# Patient Record
Sex: Male | Born: 1950 | Hispanic: Refuse to answer | Marital: Married | Smoking: Former smoker
Health system: Southern US, Community
[De-identification: ages and names within clinical notes are randomized; demographics above are authoritative.]

## PROBLEM LIST (undated history)

## (undated) DIAGNOSIS — M1990 Unspecified osteoarthritis, unspecified site: Secondary | ICD-10-CM

## (undated) DIAGNOSIS — R011 Cardiac murmur, unspecified: Secondary | ICD-10-CM

## (undated) DIAGNOSIS — K759 Inflammatory liver disease, unspecified: Secondary | ICD-10-CM

## (undated) HISTORY — PX: TONSILLECTOMY: SUR1361

## (undated) HISTORY — PX: CATARACT EXTRACTION, BILATERAL: SHX1313

## (undated) HISTORY — PX: IRRIGATION AND DEBRIDEMENT SEBACEOUS CYST: SHX5255

---

## 2012-09-19 ENCOUNTER — Other Ambulatory Visit (HOSPITAL_COMMUNITY): Payer: Self-pay | Admitting: Orthopaedic Surgery

## 2012-09-24 ENCOUNTER — Encounter (HOSPITAL_COMMUNITY): Payer: Self-pay | Admitting: Pharmacy Technician

## 2012-09-28 ENCOUNTER — Inpatient Hospital Stay (HOSPITAL_COMMUNITY): Admission: RE | Admit: 2012-09-28 | Payer: Self-pay | Source: Ambulatory Visit

## 2012-10-01 ENCOUNTER — Encounter (HOSPITAL_COMMUNITY)
Admission: RE | Admit: 2012-10-01 | Discharge: 2012-10-01 | Disposition: A | Discharge status: Home / Self Care | Payer: BC Managed Care – PPO | Source: Ambulatory Visit | Attending: Orthopaedic Surgery | Admitting: Orthopaedic Surgery

## 2012-10-01 ENCOUNTER — Encounter (HOSPITAL_COMMUNITY): Payer: Self-pay

## 2012-10-01 HISTORY — DX: Unspecified osteoarthritis, unspecified site: M19.90

## 2012-10-01 HISTORY — DX: Inflammatory liver disease, unspecified: K75.9

## 2012-10-01 HISTORY — DX: Cardiac murmur, unspecified: R01.1

## 2012-10-01 LAB — CBC
MCH: 33.1 pg (ref 26.0–34.0)
MCHC: 34.2 g/dL (ref 30.0–36.0)
Platelets: 235 10*3/uL (ref 150–400)
RBC: 4.56 MIL/uL (ref 4.22–5.81)

## 2012-10-01 LAB — BASIC METABOLIC PANEL
BUN: 13 mg/dL (ref 6–23)
Calcium: 9.9 mg/dL (ref 8.4–10.5)
GFR calc Af Amer: >90 mL/min (ref >90)
GFR calc non Af Amer: >90 mL/min (ref >90)
Glucose, Bld: 91 mg/dL (ref 70–99)
Sodium: 140 mEq/L (ref 135–145)

## 2012-10-01 LAB — URINALYSIS, ROUTINE W REFLEX MICROSCOPIC
Ketones, ur: NEGATIVE mg/dL
Nitrite: NEGATIVE
Protein, ur: NEGATIVE mg/dL

## 2012-10-01 LAB — SURGICAL PCR SCREEN: MRSA, PCR: NEGATIVE

## 2012-10-01 NOTE — Patient Instructions (Addendum)
20 Patrick Rivas [facility]  10/01/2012   Your procedure is scheduled on:   10-05-2012  Report to [facility] at    0530    AM .  Call this number if you have problems the morning of surgery: [phone]  Or Presurgical Testing [phone]([place])      Do not eat food:After Midnight.    Take these medicines the morning of surgery with A SIP OF WATER: Tramadol   Do not wear jewelry, make-up or nail polish.  Do not wear lotions, powders, or perfumes. You may wear deodorant.  Do not shave 12 hours prior to first CHG shower(legs and under arms).(face and neck okay.)  Do not bring valuables to the hospital.  Contacts, dentures or bridgework,body piercing,  may not be worn into surgery.  Leave suitcase in the car. After surgery it may be brought to your room.  For patients admitted to the hospital, checkout time is 11:00 AM the day of discharge.   Patients discharged the day of surgery will not be allowed to drive home. Must have responsible person with you x 24 hours once discharged.  Name and phone number of your driver: Patrick Rivas, spouse -[phone] cell  Special Instructions: CHG(Chlorhedine 4%-"Hibiclens","Betasept","Aplicare") Shower Use Special Wash: see special instructions.(avoid face and genitals)   Please read over the following fact sheets that you were given: MRSA Information, Blood Transfusion fact sheet, Incentive Spirometry Instruction.    Failure to follow these instructions may result in Cancellation of your surgery.   Patient signature_______________________________________________________

## 2012-10-01 NOTE — Pre-Procedure Instructions (Signed)
10-01-12 EKG 10'13 -Report with chart. W. Kairie Vangieson,RN 10-01-12 1445 Urinalysis report faxed to Dr. Alben Spittle office. W. Kennon Portela

## 2012-10-01 NOTE — Progress Notes (Signed)
10-01-12 labs now viewable in Epic. Note urinalysis report. Patrick Rivas

## 2012-10-03 LAB — URINE CULTURE: Colony Count: 40000

## 2012-10-05 ENCOUNTER — Encounter (HOSPITAL_COMMUNITY): Payer: Self-pay | Admitting: *Deleted

## 2012-10-05 ENCOUNTER — Encounter (HOSPITAL_COMMUNITY): Payer: Self-pay | Admitting: Registered Nurse

## 2012-10-05 ENCOUNTER — Inpatient Hospital Stay (HOSPITAL_COMMUNITY): Payer: BC Managed Care – PPO

## 2012-10-05 ENCOUNTER — Encounter (HOSPITAL_COMMUNITY)
Admission: RE | Disposition: A | Discharge status: Home / Self Care | Payer: Self-pay | Source: Ambulatory Visit | Attending: Orthopaedic Surgery

## 2012-10-05 ENCOUNTER — Ambulatory Visit (HOSPITAL_COMMUNITY): Payer: BC Managed Care – PPO | Admitting: Registered Nurse

## 2012-10-05 ENCOUNTER — Ambulatory Visit (HOSPITAL_COMMUNITY): Payer: BC Managed Care – PPO

## 2012-10-05 ENCOUNTER — Inpatient Hospital Stay (HOSPITAL_COMMUNITY)
Admission: RE | Admit: 2012-10-05 | Discharge: 2012-10-07 | DRG: 818 | Disposition: A | Discharge status: Home / Self Care | Payer: BC Managed Care – PPO | Source: Ambulatory Visit | Attending: Orthopaedic Surgery | Admitting: Orthopaedic Surgery

## 2012-10-05 DIAGNOSIS — IMO0002 Reserved for concepts with insufficient information to code with codable children: Secondary | ICD-10-CM | POA: Diagnosis present

## 2012-10-05 DIAGNOSIS — Z79899 Other long term (current) drug therapy: Secondary | ICD-10-CM

## 2012-10-05 DIAGNOSIS — Z9089 Acquired absence of other organs: Secondary | ICD-10-CM

## 2012-10-05 DIAGNOSIS — R29898 Other symptoms and signs involving the musculoskeletal system: Secondary | ICD-10-CM | POA: Diagnosis present

## 2012-10-05 DIAGNOSIS — M1610 Unilateral primary osteoarthritis, unspecified hip: Principal | ICD-10-CM | POA: Diagnosis present

## 2012-10-05 DIAGNOSIS — Z792 Long term (current) use of antibiotics: Secondary | ICD-10-CM

## 2012-10-05 DIAGNOSIS — Z7982 Long term (current) use of aspirin: Secondary | ICD-10-CM

## 2012-10-05 DIAGNOSIS — R011 Cardiac murmur, unspecified: Secondary | ICD-10-CM | POA: Diagnosis present

## 2012-10-05 DIAGNOSIS — Z87891 Personal history of nicotine dependence: Secondary | ICD-10-CM

## 2012-10-05 DIAGNOSIS — M169 Osteoarthritis of hip, unspecified: Secondary | ICD-10-CM

## 2012-10-05 HISTORY — PX: TOTAL HIP ARTHROPLASTY: SHX124

## 2012-10-05 SURGERY — ARTHROPLASTY, HIP, TOTAL, ANTERIOR APPROACH
Anesthesia: Spinal | Site: Hip | Laterality: Right | Wound class: Clean

## 2012-10-05 MED ORDER — ACETAMINOPHEN 10 MG/ML IV SOLN
INTRAVENOUS | Status: DC | PRN
  Administered 2012-10-05: 1000 mg via INTRAVENOUS

## 2012-10-05 MED ORDER — PHENOL 1.4 % MT LIQD
1.0000 | OROMUCOSAL | Status: DC | PRN
  Filled 2012-10-05: qty 177

## 2012-10-05 MED ORDER — PHENYLEPHRINE HCL 10 MG/ML IJ SOLN
INTRAMUSCULAR | Status: DC | PRN
  Administered 2012-10-05: 40 ug via INTRAVENOUS
  Administered 2012-10-05: 80 ug via INTRAVENOUS

## 2012-10-05 MED ORDER — ALUM & MAG HYDROXIDE-SIMETH 200-200-20 MG/5ML PO SUSP: 30.0000 mL | ORAL | Status: DC | PRN

## 2012-10-05 MED ORDER — METHOCARBAMOL 100 MG/ML IJ SOLN: 500.0000 mg | Freq: Four times a day (QID) | INTRAVENOUS | Status: DC | PRN

## 2012-10-05 MED ORDER — METHOCARBAMOL 500 MG PO TABS
ORAL_TABLET | ORAL | Status: AC
  Administered 2012-10-06: 500 mg via ORAL
  Filled 2012-10-05: qty 1

## 2012-10-05 MED ORDER — METHOCARBAMOL 500 MG PO TABS
500.0000 mg | ORAL_TABLET | Freq: Four times a day (QID) | ORAL | Status: DC | PRN
  Administered 2012-10-05 – 2012-10-07 (×3): 500 mg via ORAL
  Filled 2012-10-05 (×4): qty 1

## 2012-10-05 MED ORDER — CEFAZOLIN SODIUM 1-5 GM-% IV SOLN
1.0000 g | Freq: Four times a day (QID) | INTRAVENOUS | Status: AC
  Administered 2012-10-05 (×2): 1 g via INTRAVENOUS
  Filled 2012-10-05 (×2): qty 50

## 2012-10-05 MED ORDER — ZOLPIDEM TARTRATE 5 MG PO TABS: 5.0000 mg | ORAL_TABLET | Freq: Every evening | ORAL | Status: DC | PRN

## 2012-10-05 MED ORDER — DOCUSATE SODIUM 100 MG PO CAPS
100.0000 mg | ORAL_CAPSULE | Freq: Two times a day (BID) | ORAL | Status: DC
  Administered 2012-10-05 – 2012-10-07 (×5): 100 mg via ORAL
  Filled 2012-10-05 (×6): qty 1

## 2012-10-05 MED ORDER — CEFAZOLIN SODIUM-DEXTROSE 2-3 GM-% IV SOLR
INTRAVENOUS | Status: AC
  Filled 2012-10-05: qty 50

## 2012-10-05 MED ORDER — FENTANYL CITRATE 0.05 MG/ML IJ SOLN
INTRAMUSCULAR | Status: DC | PRN
  Administered 2012-10-05: 100 ug via INTRAVENOUS

## 2012-10-05 MED ORDER — OXYCODONE HCL 5 MG PO TABS
5.0000 mg | ORAL_TABLET | ORAL | Status: DC | PRN
  Administered 2012-10-05 – 2012-10-06 (×7): 5 mg via ORAL
  Administered 2012-10-06: 10 mg via ORAL
  Administered 2012-10-06 – 2012-10-07 (×3): 5 mg via ORAL
  Filled 2012-10-05: qty 1
  Filled 2012-10-05: qty 2
  Filled 2012-10-05 (×9): qty 1

## 2012-10-05 MED ORDER — METOCLOPRAMIDE HCL 5 MG/ML IJ SOLN: 5.0000 mg | Freq: Three times a day (TID) | INTRAMUSCULAR | Status: DC | PRN

## 2012-10-05 MED ORDER — ACETAMINOPHEN 10 MG/ML IV SOLN
INTRAVENOUS | Status: AC
  Filled 2012-10-05: qty 100

## 2012-10-05 MED ORDER — HYDROMORPHONE HCL PF 1 MG/ML IJ SOLN: 0.2500 mg | INTRAMUSCULAR | Status: DC | PRN

## 2012-10-05 MED ORDER — LACTATED RINGERS IV SOLN: INTRAVENOUS | Status: DC

## 2012-10-05 MED ORDER — SODIUM CHLORIDE 0.9 % IV SOLN
INTRAVENOUS | Status: DC | PRN
  Administered 2012-10-05: 1000 mL

## 2012-10-05 MED ORDER — ONDANSETRON HCL 4 MG/2ML IJ SOLN: 4.0000 mg | Freq: Four times a day (QID) | INTRAMUSCULAR | Status: DC | PRN

## 2012-10-05 MED ORDER — DIPHENHYDRAMINE HCL 12.5 MG/5ML PO ELIX: 12.5000 mg | ORAL_SOLUTION | ORAL | Status: DC | PRN

## 2012-10-05 MED ORDER — PHENYLEPHRINE HCL 10 MG/ML IJ SOLN
10.0000 mg | INTRAVENOUS | Status: DC | PRN
  Administered 2012-10-05: 25 ug/min via INTRAVENOUS

## 2012-10-05 MED ORDER — ACETAMINOPHEN 650 MG RE SUPP: 650.0000 mg | Freq: Four times a day (QID) | RECTAL | Status: DC | PRN

## 2012-10-05 MED ORDER — ACETAMINOPHEN 325 MG PO TABS
650.0000 mg | ORAL_TABLET | Freq: Four times a day (QID) | ORAL | Status: DC | PRN
  Administered 2012-10-06: 650 mg via ORAL
  Filled 2012-10-05: qty 2

## 2012-10-05 MED ORDER — BUPIVACAINE HCL (PF) 0.5 % IJ SOLN
INTRAMUSCULAR | Status: DC | PRN
  Administered 2012-10-05: 3 mL

## 2012-10-05 MED ORDER — OXYCODONE HCL ER 20 MG PO T12A
20.0000 mg | EXTENDED_RELEASE_TABLET | Freq: Two times a day (BID) | ORAL | Status: DC
  Administered 2012-10-05 – 2012-10-06 (×3): 20 mg via ORAL
  Filled 2012-10-05 (×3): qty 1

## 2012-10-05 MED ORDER — SODIUM CHLORIDE 0.9 % IV SOLN
INTRAVENOUS | Status: DC
  Administered 2012-10-05 – 2012-10-06 (×2): via INTRAVENOUS

## 2012-10-05 MED ORDER — CEFAZOLIN SODIUM-DEXTROSE 2-3 GM-% IV SOLR: 2.0000 g | INTRAVENOUS | Status: DC

## 2012-10-05 MED ORDER — MIDAZOLAM HCL 5 MG/5ML IJ SOLN
INTRAMUSCULAR | Status: DC | PRN
  Administered 2012-10-05: 2 mg via INTRAVENOUS

## 2012-10-05 MED ORDER — PROMETHAZINE HCL 25 MG/ML IJ SOLN: 6.2500 mg | INTRAMUSCULAR | Status: DC | PRN

## 2012-10-05 MED ORDER — MENTHOL 3 MG MT LOZG
1.0000 | LOZENGE | OROMUCOSAL | Status: DC | PRN
  Filled 2012-10-05 (×2): qty 9

## 2012-10-05 MED ORDER — ONDANSETRON HCL 4 MG PO TABS
4.0000 mg | ORAL_TABLET | Freq: Four times a day (QID) | ORAL | Status: DC | PRN
  Administered 2012-10-05: 4 mg via ORAL
  Filled 2012-10-05: qty 1

## 2012-10-05 MED ORDER — HYDROMORPHONE HCL PF 1 MG/ML IJ SOLN
1.0000 mg | INTRAMUSCULAR | Status: DC | PRN
  Administered 2012-10-05: 1 mg via INTRAVENOUS
  Filled 2012-10-05: qty 1

## 2012-10-05 MED ORDER — LACTATED RINGERS IV SOLN
INTRAVENOUS | Status: DC | PRN
  Administered 2012-10-05 (×2): via INTRAVENOUS

## 2012-10-05 MED ORDER — MEPERIDINE HCL 50 MG/ML IJ SOLN: 6.2500 mg | INTRAMUSCULAR | Status: DC | PRN

## 2012-10-05 MED ORDER — 0.9 % SODIUM CHLORIDE (POUR BTL) OPTIME
TOPICAL | Status: DC | PRN
  Administered 2012-10-05: 1000 mL

## 2012-10-05 MED ORDER — BUPIVACAINE HCL (PF) 0.5 % IJ SOLN
INTRAMUSCULAR | Status: AC
  Filled 2012-10-05: qty 30

## 2012-10-05 MED ORDER — CEFAZOLIN SODIUM-DEXTROSE 2-3 GM-% IV SOLR
INTRAVENOUS | Status: DC | PRN
  Administered 2012-10-05: 2 g via INTRAVENOUS

## 2012-10-05 MED ORDER — PROPOFOL 10 MG/ML IV BOLUS
INTRAVENOUS | Status: DC | PRN
  Administered 2012-10-05: 30 mg via INTRAVENOUS
  Administered 2012-10-05: 20 mg via INTRAVENOUS

## 2012-10-05 MED ORDER — METOCLOPRAMIDE HCL 10 MG PO TABS: 5.0000 mg | ORAL_TABLET | Freq: Three times a day (TID) | ORAL | Status: DC | PRN

## 2012-10-05 MED ORDER — RIVAROXABAN 10 MG PO TABS
10.0000 mg | ORAL_TABLET | Freq: Every day | ORAL | Status: DC
  Administered 2012-10-06 – 2012-10-07 (×2): 10 mg via ORAL
  Filled 2012-10-05 (×3): qty 1

## 2012-10-05 MED ORDER — PROPOFOL INFUSION 10 MG/ML OPTIME
INTRAVENOUS | Status: DC | PRN
  Administered 2012-10-05: 50 ug/kg/min via INTRAVENOUS
  Administered 2012-10-05: 100 ug/kg/min via INTRAVENOUS

## 2012-10-05 SURGICAL SUPPLY — 42 items
BAG ZIPLOCK 12X15 (MISCELLANEOUS) ×2 IMPLANT
BLADE SAW SGTL 18X1.27X75 (BLADE) ×2 IMPLANT
CELLS DAT CNTRL 66122 CELL SVR (MISCELLANEOUS) ×1 IMPLANT
CLOTH BEACON ORANGE TIMEOUT ST (SAFETY) ×2 IMPLANT
DERMABOND ADVANCED (GAUZE/BANDAGES/DRESSINGS) ×1
DERMABOND ADVANCED .7 DNX12 (GAUZE/BANDAGES/DRESSINGS) ×1 IMPLANT
DRAPE C-ARM 42X72 X-RAY (DRAPES) ×2 IMPLANT
DRAPE STERI IOBAN 125X83 (DRAPES) ×2 IMPLANT
DRAPE U-SHAPE 47X51 STRL (DRAPES) ×6 IMPLANT
DRSG AQUACEL AG ADV 3.5X10 (GAUZE/BANDAGES/DRESSINGS) ×2 IMPLANT
DURAPREP 26ML APPLICATOR (WOUND CARE) ×2 IMPLANT
ELECT BLADE TIP CTD 4 INCH (ELECTRODE) ×2 IMPLANT
ELECT REM PT RETURN 9FT ADLT (ELECTROSURGICAL) ×2
ELECTRODE REM PT RTRN 9FT ADLT (ELECTROSURGICAL) ×1 IMPLANT
ELIMINATOR HOLE APEX DEPUY (Hips) IMPLANT
EVACUATOR 1/8 PVC DRAIN (DRAIN) ×2 IMPLANT
FACESHIELD LNG OPTICON STERILE (SAFETY) ×10 IMPLANT
GLOVE BIO SURGEON STRL SZ7 (GLOVE) ×2 IMPLANT
GLOVE BIO SURGEON STRL SZ7.5 (GLOVE) ×2 IMPLANT
GLOVE BIOGEL PI IND STRL 7.5 (GLOVE) IMPLANT
GLOVE BIOGEL PI IND STRL 8 (GLOVE) ×1 IMPLANT
GLOVE BIOGEL PI INDICATOR 7.5 (GLOVE)
GLOVE BIOGEL PI INDICATOR 8 (GLOVE) ×1
GLOVE ECLIPSE 7.0 STRL STRAW (GLOVE) ×2 IMPLANT
GOWN STRL REIN XL XLG (GOWN DISPOSABLE) ×4 IMPLANT
HANDPIECE INTERPULSE COAX TIP (DISPOSABLE) ×1
KIT BASIN OR (CUSTOM PROCEDURE TRAY) ×2 IMPLANT
PACK TOTAL JOINT (CUSTOM PROCEDURE TRAY) ×2 IMPLANT
PADDING CAST COTTON 6X4 STRL (CAST SUPPLIES) ×2 IMPLANT
RTRCTR WOUND ALEXIS 18CM MED (MISCELLANEOUS) ×2
SET HNDPC FAN SPRY TIP SCT (DISPOSABLE) ×1 IMPLANT
SUT ETHIBOND NAB CT1 #1 30IN (SUTURE) ×6 IMPLANT
SUT MNCRL AB 4-0 PS2 18 (SUTURE) ×2 IMPLANT
SUT VIC AB 1 CT1 36 (SUTURE) ×2 IMPLANT
SUT VIC AB 2-0 CT1 27 (SUTURE)
SUT VIC AB 2-0 CT1 TAPERPNT 27 (SUTURE) IMPLANT
SUT VIC AB 3-0 CT1 27 (SUTURE) ×1
SUT VIC AB 3-0 CT1 TAPERPNT 27 (SUTURE) ×1 IMPLANT
SUT VLOC 180 0 24IN GS25 (SUTURE) ×2 IMPLANT
TOWEL OR 17X26 10 PK STRL BLUE (TOWEL DISPOSABLE) ×4 IMPLANT
TOWEL OR NON WOVEN STRL DISP B (DISPOSABLE) ×2 IMPLANT
TRAY FOLEY CATH 14FRSI W/METER (CATHETERS) ×2 IMPLANT

## 2012-10-05 NOTE — Preoperative (Signed)
Beta Blockers   Reason not to administer Beta Blockers:Not Applicable 

## 2012-10-05 NOTE — Evaluation (Signed)
Physical Therapy Evaluation Patient Details Name: Patrick Rivas MRN: [id number] DOB: November 18, 1950 Today's Date: 10/05/2012 Time: [phone] PT Time Calculation (min): 33 min  PT Assessment / Plan / Recommendation Clinical Impression  Pt s/p R THR presents with decreased R LE strength/ROM and post op pain limiting functional mobility    PT Assessment  Patient needs continued PT services    Follow Up Recommendations  Home health PT    Does the patient have the potential to tolerate intense rehabilitation      Barriers to Discharge None      Equipment Recommendations  Rolling walker with 5" wheels    Recommendations for Other Services OT consult   Frequency 7X/week    Precautions / Restrictions Precautions Precautions: Fall Restrictions Weight Bearing Restrictions: No Other Position/Activity Restrictions: WBAT   Pertinent Vitals/Pain 3/10; premed, ice pack provided      Mobility  Bed Mobility Bed Mobility: Supine to Sit;Sit to Supine Supine to Sit: 4: Min assist;3: Mod assist Sit to Supine: 4: Min assist;3: Mod assist Details for Bed Mobility Assistance: cues for sequence and use of L LE to self assist Transfers Transfers: Sit to Stand;Stand to Sit Sit to Stand: 4: Min assist;3: Mod assist Stand to Sit: 4: Min assist;3: Mod assist Details for Transfer Assistance: cues for LE management and use of UEs to self assist Ambulation/Gait Ambulation/Gait Assistance: 4: Min assist;3: Mod assist Ambulation Distance (Feet): 46 Feet Assistive device: Rolling walker Ambulation/Gait Assistance Details: cues for posture, sequence and position from RW Gait Pattern: Step-to pattern;Decreased step length - right;Decreased step length - left    Exercises Total Joint Exercises Ankle Circles/Pumps: AROM;10 reps;Supine;Both Quad Sets: AROM;Both;10 reps;Supine Heel Slides: AAROM;10 reps;Supine;Right Hip ABduction/ADduction: AAROM;10 reps;Supine;Right   PT Diagnosis: Difficulty  walking  PT Problem List: Decreased strength;Decreased range of motion;Decreased activity tolerance;Decreased mobility;Decreased knowledge of use of DME;Pain PT Treatment Interventions: DME instruction;Gait training;Stair training;Functional mobility training;Therapeutic activities;Therapeutic exercise;Patient/family education   PT Goals Acute Rehab PT Goals PT Goal Formulation: With patient Time For Goal Achievement: 10/10/12 Potential to Achieve Goals: Good Pt will go Supine/Side to Sit: with supervision PT Goal: Supine/Side to Sit - Progress: Goal set today Pt will go Sit to Supine/Side: with supervision PT Goal: Sit to Supine/Side - Progress: Goal set today Pt will go Sit to Stand: with supervision PT Goal: Sit to Stand - Progress: Goal set today Pt will go Stand to Sit: with supervision PT Goal: Stand to Sit - Progress: Goal set today Pt will Ambulate: >150 feet;with supervision;with rolling walker PT Goal: Ambulate - Progress: Goal set today Pt will Go Up / Down Stairs: 3-5 stairs;with min assist;with least restrictive assistive device PT Goal: Up/Down Stairs - Progress: Goal set today  Visit Information  Last PT Received On: 10/05/12 Assistance Needed: +1    Subjective Data  Subjective: I can't beleive I'm doing  this and only 6 hrs after surgery Patient Stated Goal: Resume previous lifestyle with decreased pain   Prior Functioning  Home Living Lives With: Spouse Available Help at Discharge: Family Type of Home: House Home Access: Stairs to enter Secretary/administrator of Steps: 4 Entrance Stairs-Rails: None Home Layout: Able to live on main level with bedroom/bathroom Home Adaptive Equipment: Crutches Prior Function Level of Independence: Independent Able to Take Stairs?: Yes Driving: Yes Vocation: Full time employment Communication Communication: No difficulties    Cognition  Cognition Overall Cognitive Status: Appears within functional limits for tasks  assessed/performed Arousal/Alertness: Awake/alert Orientation Level: Appears intact for tasks  assessed Behavior During Session: [facility] for tasks performed    Extremity/Trunk Assessment Right Upper Extremity Assessment RUE ROM/Strength/Tone: [facility] for tasks assessed Left Upper Extremity Assessment LUE ROM/Strength/Tone: [facility] for tasks assessed Right Lower Extremity Assessment RLE ROM/Strength/Tone: Deficits RLE ROM/Strength/Tone Deficits: hip strength 2+/5 with aarom at hip to 85 flex and 20 abd Left Lower Extremity Assessment LLE ROM/Strength/Tone: Deficits LLE ROM/Strength/Tone Deficits: 20 degrees abd   Balance    End of Session PT - End of Session Equipment Utilized During Treatment: Gait belt Activity Tolerance: Patient tolerated treatment well Patient left: in bed;with call bell/phone within reach;with family/visitor present Nurse Communication: Mobility status  GP     Tanielle Emigh 10/05/2012, 4:28 PM

## 2012-10-05 NOTE — Plan of Care (Signed)
Problem: Consults Goal: Diagnosis- Total Joint Replacement Primary Total Hip     

## 2012-10-05 NOTE — Anesthesia Procedure Notes (Signed)
Spinal  Patient location during procedure: OR Staffing Anesthesiologist: CARIGNAN, PETER Performed by: anesthesiologist  Preanesthetic Checklist Completed: patient identified, site marked, surgical consent, pre-op evaluation, timeout performed, IV checked, risks and benefits discussed and monitors and equipment checked Spinal Block Patient position: sitting Prep: Betadine Patient monitoring: heart rate, continuous pulse ox and blood pressure Approach: right paramedian Location: L2-3 Injection technique: single-shot Needle Needle type: Spinocan  Needle gauge: 22 G Needle length: 9 cm Additional Notes Expiration date of kit checked and confirmed. Patient tolerated procedure well, without complications.     

## 2012-10-05 NOTE — Care Management (Signed)
[facility] is following this pt. Please notify the [facility] branch of discharge orders  [phone].  Fax [phone].  Thank You Elwin Sleight

## 2012-10-05 NOTE — Anesthesia Preprocedure Evaluation (Addendum)
Anesthesia Evaluation  Patient identified by MRN, date of birth, ID band Patient awake    Reviewed: Allergy & Precautions, H&P , NPO status , Patient's Chart, lab work & pertinent test results  Airway Mallampati: II TM Distance: >3 FB Neck ROM: Full    Dental no notable dental hx.    Pulmonary neg pulmonary ROS,  breath sounds clear to auscultation  Pulmonary exam normal       Cardiovascular negative cardio ROS  Rhythm:Regular Rate:Normal     Neuro/Psych negative neurological ROS  negative psych ROS   GI/Hepatic negative GI ROS, Neg liver ROS,   Endo/Other  negative endocrine ROS  Renal/GU negative Renal ROS  negative genitourinary   Musculoskeletal negative musculoskeletal ROS (+)   Abdominal   Peds negative pediatric ROS (+)  Hematology negative hematology ROS (+)   Anesthesia Other Findings   Reproductive/Obstetrics negative OB ROS                           Anesthesia Physical Anesthesia Plan  ASA: II  Anesthesia Plan: Spinal   Post-op Pain Management:    Induction: Intravenous  Airway Management Planned: Simple Face Mask  Additional Equipment:   Intra-op Plan:   Post-operative Plan: Extubation in OR  Informed Consent: I have reviewed the patients History and Physical, chart, labs and discussed the procedure including the risks, benefits and alternatives for the proposed anesthesia with the patient or authorized representative who has indicated his/her understanding and acceptance.   Dental advisory given  Plan Discussed with: CRNA  Anesthesia Plan Comments:         Anesthesia Quick Evaluation  

## 2012-10-05 NOTE — Brief Op Note (Signed)
10/05/2012  9:49 AM  PATIENT:  Patrick Rivas  62 y.o. male  PRE-OPERATIVE DIAGNOSIS:  Severe osteoarthritis right hip  POST-OPERATIVE DIAGNOSIS:  Severe osteoarthritis right hip  PROCEDURE:  Procedure(s): Right TOTAL HIP ARTHROPLASTY ANTERIOR APPROACH (Right)  SURGEON:  Surgeon(s) and Role:    * Kathryne Hitch, MD - Primary  PHYSICIAN ASSISTANT:   ASSISTANTS: Rexene Edison, PA-C   ANESTHESIA:   spinal  EBL:  Total I/O In: 1000 [I.V.:1000] Out: 500 [Urine:300; Blood:200]  BLOOD ADMINISTERED:none  DRAINS: none   LOCAL MEDICATIONS USED:  NONE  SPECIMEN:  No Specimen  DISPOSITION OF SPECIMEN:  N/A  COUNTS:  YES  TOURNIQUET:  * No tourniquets in log *  DICTATION: .Other Dictation: Dictation Number [id number]  PLAN OF CARE: Admit to inpatient   PATIENT DISPOSITION:  PACU - hemodynamically stable.   Delay start of Pharmacological VTE agent (>24hrs) due to surgical blood loss or risk of bleeding: no

## 2012-10-05 NOTE — Transfer of Care (Signed)
Immediate Anesthesia Transfer of Care Note  Patient: Patrick Rivas  Procedure(s) Performed: Procedure(s): Right TOTAL HIP ARTHROPLASTY ANTERIOR APPROACH (Right)  Patient Location: PACU  Anesthesia Type:Spinal  Level of Consciousness: awake, alert , oriented and patient cooperative  Airway & Oxygen Therapy: Patient Spontanous Breathing and Patient connected to face mask oxygen  Post-op Assessment: Report given to PACU RN and Post -op Vital signs reviewed and stable  Post vital signs: stable  Complications: No apparent anesthesia complications level T 12

## 2012-10-05 NOTE — Anesthesia Postprocedure Evaluation (Signed)
  Anesthesia Post-op Note  Patient: Patrick Rivas  Procedure(s) Performed: Procedure(s) (LRB): Right TOTAL HIP ARTHROPLASTY ANTERIOR APPROACH (Right)  Patient Location: PACU  Anesthesia Type: Spinal  Level of Consciousness: awake and alert   Airway and Oxygen Therapy: Patient Spontanous Breathing  Post-op Pain: mild  Post-op Assessment: Post-op Vital signs reviewed, Patient's Cardiovascular Status Stable, Respiratory Function Stable, Patent Airway and No signs of Nausea or vomiting  Last Vitals:  Filed Vitals:   10/05/12 1030  BP: 116/75  Pulse: 66  Temp: 36.5 C  Resp: 19    Post-op Vital Signs: stable   Complications: No apparent anesthesia complications

## 2012-10-05 NOTE — Anesthesia Postprocedure Evaluation (Signed)
  Anesthesia Post-op Note  Patient: Patrick Rivas  Procedure(s) Performed: Procedure(s) (LRB): Right TOTAL HIP ARTHROPLASTY ANTERIOR APPROACH (Right)  Patient Location: PACU  Anesthesia Type: Spinal  Level of Consciousness: awake and alert   Airway and Oxygen Therapy: Patient Spontanous Breathing  Post-op Pain: mild  Post-op Assessment: Post-op Vital signs reviewed, Patient's Cardiovascular Status Stable, Respiratory Function Stable, Patent Airway and No signs of Nausea or vomiting  Last Vitals:  Filed Vitals:   10/05/12 1115  BP: 120/83  Pulse: 62  Temp: 36.4 C  Resp: 14    Post-op Vital Signs: stable   Complications: No apparent anesthesia complications

## 2012-10-05 NOTE — H&P (Signed)
TOTAL HIP ADMISSION H&P  Patient is admitted for right total hip arthroplasty.  Subjective:  Chief Complaint: right hip pain  HPI: Patrick Rivas, 62 y.o. male, has a history of pain and functional disability in the right hip(s) due to arthritis and patient has failed non-surgical conservative treatments for greater than 12 Royce to include NSAID's and/or analgesics, corticosteriod injections and activity modification.  Onset of symptoms was gradual starting 9 years ago with gradually worsening course since that time.The patient noted no past surgery on the right hip(s).  Patient currently rates pain in the right hip at 8 out of 10 with activity. Patient has worsening of pain with activity and weight bearing, pain that interfers with activities of daily living, pain with passive range of motion and crepitus. Patient has evidence of subchondral cysts, subchondral sclerosis, periarticular osteophytes and joint space narrowing by imaging studies. This condition presents safety issues increasing the risk of falls.  There is no current active infection.  Patient Active Problem List   Diagnosis Date Noted  . Degenerative arthritis of hip 10/05/2012   Past Medical History  Diagnosis Date  . Heart murmur   . Arthritis     Osteoarthritis-rt. hip, back(degenerative disc)  . Hepatitis     50  yrs ago" mild"-no problems now    Past Surgical History  Procedure Laterality Date  . Irrigation and debridement sebaceous cyst      x2- on back -none recent  . Tonsillectomy    . Cataract extraction, bilateral      Prescriptions prior to admission  Medication Sig Dispense Refill  . ampicillin (PRINCIPEN) 250 MG capsule Take 500 mg by mouth 2 (two) times daily.       . traMADol (ULTRAM) 50 MG tablet Take 50 mg by mouth every 6 (six) hours as needed for pain.      Marland Kitchen aspirin 325 MG tablet Take 325 mg by mouth as directed. 5 times per week      . B Complex-C (B-COMPLEX WITH VITAMIN C) tablet Take 1 tablet  by mouth daily.      . cholecalciferol (VITAMIN D) 1000 UNITS tablet Take 1,000 Units by mouth daily.      . diclofenac (VOLTAREN) 75 MG EC tablet Take 75 mg by mouth 2 (two) times daily.      . fish oil-omega-3 fatty acids 1000 MG capsule Take 1 g by mouth daily.      Marland Kitchen glucosamine-chondroitin 500-400 MG tablet Take 2 tablets by mouth 2 (two) times daily.       No Known Allergies  History  Substance Use Topics  . Smoking status: Former Games developer  . Smokeless tobacco: Not on file  . Alcohol Use: Yes     Comment: beer -occ    History reviewed. No pertinent family history.   Review of Systems  Musculoskeletal: Positive for joint pain.  All other systems reviewed and are negative.    Objective:  Physical Exam  Constitutional: He is oriented to person, place, and time. He appears well-developed and well-nourished.  HENT:  Head: Normocephalic and atraumatic.  Eyes: EOM are normal. Pupils are equal, round, and reactive to light.  Neck: Normal range of motion. Neck supple.  Cardiovascular: Normal rate and regular rhythm.   Respiratory: Effort normal and breath sounds normal.  GI: Soft. Bowel sounds are normal.  Musculoskeletal:       Right hip: He exhibits decreased strength, bony tenderness and crepitus.  Neurological: He is alert and oriented to  person, place, and time.  Skin: Skin is warm and dry.  Psychiatric: He has a normal mood and affect.    Vital signs in last 24 hours: Temp:  [98.1 F (36.7 C)] 98.1 F (36.7 C) (03/07 0528) Pulse Rate:  [109] 109 (03/07 0528) Resp:  [18] 18 (03/07 0528) BP: (138)/(71) 138/71 mmHg (03/07 0528) SpO2:  [97 %] 97 % (03/07 0528)  Labs:   There is no weight on file to calculate BMI.   Imaging Review Plain radiographs demonstrate severe degenerative joint disease of the right hip(s). The bone quality appears to be good for age and reported activity level.  Assessment/Plan:  End stage arthritis, right hip(s)  The patient history,  physical examination, clinical judgement of the Khamora Karan and imaging studies are consistent with end stage degenerative joint disease of the right hip(s) and total hip arthroplasty is deemed medically necessary. The treatment options including medical management, injection therapy, arthroscopy and arthroplasty were discussed at length. The risks and benefits of total hip arthroplasty were presented and reviewed. The risks due to aseptic loosening, infection, stiffness, dislocation/subluxation,  thromboembolic complications and other imponderables were discussed.  The patient acknowledged the explanation, agreed to proceed with the plan and consent was signed. Patient is being admitted for inpatient treatment for surgery, pain control, PT, OT, prophylactic antibiotics, VTE prophylaxis, progressive ambulation and ADL's and discharge planning.The patient is planning to be discharged home with home health services

## 2012-10-06 LAB — CBC
HCT: 32 % — ABNORMAL LOW (ref 39.0–52.0)
MCHC: 34.1 g/dL (ref 30.0–36.0)
MCV: 97 fL (ref 78.0–100.0)
RDW: 12.1 % (ref 11.5–15.5)

## 2012-10-06 LAB — BASIC METABOLIC PANEL
BUN: 11 mg/dL (ref 6–23)
Chloride: 105 mEq/L (ref 96–112)
Creatinine, Ser: 0.97 mg/dL (ref 0.50–1.35)
GFR calc Af Amer: >90 mL/min (ref >90)
GFR calc non Af Amer: 89 mL/min — ABNORMAL LOW (ref >90)

## 2012-10-06 NOTE — Evaluation (Signed)
Occupational Therapy Evaluation Patient Details Name: Patrick Rivas MRN: [id number] DOB: March 04, 1951 Today's Date: 10/06/2012 Time: 1209-1223 OT Time Calculation (min): 14 min  OT Assessment / Plan / Recommendation Clinical Impression  This 62 year old man was admitted for R anterior direct THA.  All education was completed, and pt does not need any further OT.  He has a tub bench and will try standard commode at home.  If he has difficulty, he will let HH know.      OT Assessment  Patient does not need any further OT services    Follow Up Recommendations  No OT follow up    Barriers to Discharge      Equipment Recommendations  None recommended by OT (wants to try standard commode at home)    Recommendations for Other Services    Frequency       Precautions / Restrictions Precautions Precautions: Fall Restrictions Other Position/Activity Restrictions: WBAT   Pertinent Vitals/Pain RLE painful when walking:  Just had medication.  Repositioned and ice applied    ADL  Grooming: Teeth care;Supervision/safety Where Assessed - Grooming: Unsupported standing Toilet Transfer: Minimal assistance Toilet Transfer Method: Sit to stand Toilet Transfer Equipment: Grab bars;Comfort height toilet Transfers/Ambulation Related to ADLs: pt had difficulty lifting/advancing RLE--toes dragged and pt compensated by hiking hip.  Initially helped pt advance leg then he used compensation.   ADL Comments: Pt can reach to toes--set up for most adls/min A for LB dressing.  Wife will assist.  Pt wants to try his low commode at home--wife can help.  If he has any difficulty, he will let HH know.      OT Diagnosis:    OT Problem List:   OT Treatment Interventions:     OT Goals    Visit Information  Last OT Received On: 10/06/12 Assistance Needed: +1    Subjective Data  Subjective: I was doing better yesterday Patient Stated Goal: none stated.  Agreeable to OT   Prior Functioning     Home  Living Lives With: Spouse Bathroom Shower/Tub: Engineer, manufacturing systems: Standard (vanity next to toilet) Home Adaptive Equipment: Crutches Prior Function Level of Independence: Independent Able to Take Stairs?: Yes Driving: Yes Vocation: Full time employment Communication Communication: No difficulties         Vision/Perception     Cognition  Cognition Overall Cognitive Status: Appears within functional limits for tasks assessed/performed Arousal/Alertness: Awake/alert Orientation Level: Appears intact for tasks assessed Behavior During Session: [facility] for tasks performed    Extremity/Trunk Assessment Right Upper Extremity Assessment RUE ROM/Strength/Tone: Within functional levels Left Upper Extremity Assessment LUE ROM/Strength/Tone: Within functional levels     Mobility Bed Mobility Sit to Supine: 4: Min assist Transfers Sit to Stand: 4: Min guard Details for Transfer Assistance: cues for hand placement     Exercise     Balance     End of Session OT - End of Session Activity Tolerance: Patient tolerated treatment well Patient left: in bed;with call bell/phone within reach;with family/visitor present  GO     SPENCER,MARYELLEN 10/06/2012, 12:56 PM Marica Otter, OTR/L [phone] 10/06/2012

## 2012-10-06 NOTE — Progress Notes (Signed)
Physical Therapy Treatment Patient Details Name: Patrick Rivas MRN: [id number] DOB: 1951/03/25 Today's Date: 10/06/2012 Time: 5621-3086 PT Time Calculation (min): 30 min  PT Assessment / Plan / Recommendation Comments on Treatment Session       Follow Up Recommendations  Home health PT     Does the patient have the potential to tolerate intense rehabilitation     Barriers to Discharge        Equipment Recommendations  Rolling walker with 5" wheels    Recommendations for Other Services OT consult  Frequency 7X/week   Plan Discharge plan remains appropriate    Precautions / Restrictions Precautions Precautions: Fall Restrictions Weight Bearing Restrictions: No Other Position/Activity Restrictions: WBAT   Pertinent Vitals/Pain Min pain at rest; 3/10 at end of walk; premedicated, ice packs provided    Mobility  Bed Mobility Bed Mobility: Supine to Sit Supine to Sit: 4: Min assist;3: Mod assist Details for Bed Mobility Assistance: cues for sequence and use of L LE to self assist Transfers Transfers: Sit to Stand;Stand to Sit Sit to Stand: 4: Min guard;From bed;4: Min assist Stand to Sit: 4: Min guard;4: Min assist Details for Transfer Assistance: cues for hand placement Ambulation/Gait Ambulation/Gait Assistance: 4: Min Environmental consultant (Feet): 244 Feet Assistive device: Rolling walker Ambulation/Gait Assistance Details: Cues for posture, sequence, stride length, ER on R, position from RW, increased DF on R Gait Pattern: Step-to pattern;Step-through pattern;Decreased step length - left;Decreased stance time - right;Right hip hike    Exercises     PT Diagnosis:    PT Problem List:   PT Treatment Interventions:     PT Goals Acute Rehab PT Goals PT Goal Formulation: With patient Time For Goal Achievement: 10/10/12 Potential to Achieve Goals: Good Pt will go Supine/Side to Sit: with supervision PT Goal: Supine/Side to Sit - Progress: Progressing  toward goal Pt will go Sit to Supine/Side: with supervision PT Goal: Sit to Supine/Side - Progress: Progressing toward goal Pt will go Sit to Stand: with supervision PT Goal: Sit to Stand - Progress: Progressing toward goal Pt will go Stand to Sit: with supervision PT Goal: Stand to Sit - Progress: Progressing toward goal Pt will Ambulate: >150 feet;with supervision;with rolling walker PT Goal: Ambulate - Progress: Progressing toward goal Pt will Go Up / Down Stairs: 3-5 stairs;with min assist;with least restrictive assistive device  Visit Information  Last PT Received On: 10/06/12 Assistance Needed: +1    Subjective Data  Subjective: I'm doing better than this morning Patient Stated Goal: Resume previous lifestyle with decreased pain   Cognition  Cognition Overall Cognitive Status: Appears within functional limits for tasks assessed/performed Arousal/Alertness: Awake/alert Orientation Level: Appears intact for tasks assessed Behavior During Session: [facility] for tasks performed    Balance     End of Session PT - End of Session Activity Tolerance: Patient tolerated treatment well Patient left: with call bell/phone within reach;with family/visitor present;in chair Nurse Communication: Mobility status   GP     BRADSHAW,HUNTER 10/06/2012, 4:39 PM

## 2012-10-06 NOTE — Progress Notes (Signed)
Subjective: 1 Day Post-Op Procedure(s) (LRB): Right TOTAL HIP ARTHROPLASTY ANTERIOR APPROACH (Right) Patient reports pain as moderate.  Doing well a little more pain today. Otherwise no complaints.  Objective: Vital signs in last 24 hours: Temp:  [96.7 F (35.9 C)-100.2 F (37.9 C)] 99.1 F (37.3 C) (03/08 0617) Pulse Rate:  [62-91] 85 (03/08 0617) Resp:  [12-19] 18 (03/08 0617) BP: (102-129)/(55-85) 105/55 mmHg (03/08 0617) SpO2:  [100 %] 100 % (03/08 0617) Weight:  [102 kg (224 lb 13.9 oz)] 102 kg (224 lb 13.9 oz) (03/07 1115)  Intake/Output from previous day: 03/07 0701 - 03/08 0700 In: 5367.5 [P.O.:1080; I.V.:4287.5] Out: 3550 [Urine:3350; Blood:200] Intake/Output this shift: Total I/O In: 240 [P.O.:240] Out: -    Recent Labs  10/06/12 0522  HGB 10.9*    Recent Labs  10/06/12 0522  WBC 5.1  RBC 3.30*  HCT 32.0*  PLT 171    Recent Labs  10/06/12 0522  NA 137  K 4.2  CL 105  CO2 27  BUN 11  CREATININE 0.97  GLUCOSE 107*  CALCIUM 8.1*   No results found for this basename: LABPT, INR,  in the last 72 hours  Right Lower leg: Neurologically intact Neurovascular intact Incision: dressing C/D/I  Assessment/Plan: 1 Day Post-Op Procedure(s) (LRB): Right TOTAL HIP ARTHROPLASTY ANTERIOR APPROACH (Right) Up with therapy Dispo home possible Sunday or Monday.  Richardean Canal 10/06/2012, 9:51 AM

## 2012-10-06 NOTE — Op Note (Signed)
NAMEBRADEN, CIMO                ACCOUNT NO.:  [id number]  MEDICAL RECORD NO.:  [id number]  LOCATION:  1526                         FACILITY:  [facility]  PHYSICIAN:  Vanita Panda. Magnus Ivan, M.D.DATE OF BIRTH:  03/25/1951  DATE OF PROCEDURE:  10/05/2012 DATE OF DISCHARGE:                              OPERATIVE REPORT   PREOPERATIVE DIAGNOSES:  End-stage arthritis and degenerative joint disease, right hip.  POSTOPERATIVE DIAGNOSES:  End-stage arthritis and degenerative joint disease, right hip.  PROCEDURE:  Right total hip arthroplasty direct anterior approach.  IMPLANTS:  DePuy Sector Gription acetabular component size 58, apex hole eliminator guide and 1 screw, size 36+ 4 neutral polyethylene liner, size 9 Corail femoral component with varus offset (KLA), size 36+ 1.5 ceramic hip ball.  SURGEON:  Vanita Panda. Magnus Ivan, M.D.  ASSISTANT:  Richardean Canal, PA-C, whose assistance was needed throughout the case and was integral and patient positioning, exposure of the hip, placement of the implants, and closure of the wound.  ANESTHESIA:  Spinal.  ANTIBIOTICS:  2 g IV Ancef.  BLOOD LOSS:  200 mL.  COMPLICATIONS:  None.  INDICATIONS:  Mr. Patrick Rivas is a 62 year old gentleman with severe end-stage arthritis involving his right hip.  This has gone on for over 9 years now.  He has felt every bit of conservative therapy including rest, anti- inflammatories, exercises, assistive device, and discussed with him the steroid injections and activity modification.  With the failure of all these and the impact he has had on his activities of daily living, his pain daily and his mobility is very limited.  He wished to proceed with a total hip arthroplasty.  He understands the goals are decreased pain, improved mobility and improved quality of life.  He understands the risks, fracture, infection, nerve and vessel injury, and DVT, and again the goals that we have already mentioned.  He does  wish to proceed.  PROCEDURE DESCRIPTION:  After informed consent was obtained, appropriate right hip was marked.  He was brought to the operating room and spinal anesthesia was obtained while he was sitting upright on the stretcher. He was then laid in supine position.  The Foley catheter was placed and then his feet had traction boots applied to them.  He was then placed supine on the Hana fracture table with a perineal post in place and both legs in inline skeletal traction, but no traction applied.  We then assessed the right hip and center of the pelvis for gaining leg lengths using direct fluoroscopy.  We then prepped the right hip with DuraPrep and sterile drapes.  A time-out was called to identify the correct patient, correct right hip.  We then brought the C-arm out as well.  We then made an incision just inferior and posterior to the anterior- superior iliac spine and carried this down to the tensor fascia lata. The tensor fascia was then divided longitudinally and proceeded with a direct anterior approach to the hip.  A Cobra retractor was placed around the lateral neck and up underneath the rectus femoris.  A Cobra retractors were placed medially.  I cauterized the lateral femoral circumflex vessels.  We then entered the hip capsule using an  L-shaped incision and placed the Cobra retractors within the hip capsule.  I then used an oscillating saw to make my femoral neck cut just proximal to the lesser trochanter.  I used an osteotome to complete the cut.  I then used a corkscrew guide in the femoral head and removed the femoral head in its entirety and found it to be completely devoid of cartilage. There was sclerotic bone, and it was hard as a rock.  There was marginal osteophytes all around the acetabulum with a clean as well as remnants of the acetabular labrum.  We placed the Bent Hohmann medially and a Cobra retractor laterally.  We then began reaming from a size 44  reamer in 2 mm increments all the way up to size 58 with all reamers placed under direct visualization.  The last reamer also placed under direct fluoroscopy, so we could obtain our depth of reaming, our inclination, and anteversion.  I was pleased with this.  We placed the real Sector Gription acetabular component size 58.  We placed the apex hole eliminator guide and I did place a single screw due to the sclerotic bone.  I then placed the real 36+ 4 neutral polyethylene liner. Attention was then turned to the femur with the leg externally rotated to 90 degrees, extended and adducted.  I placed a Mueller retractor medially and a Hohmann retractor behind the greater trochanter.  I used a rongeur to lateralize and a box cutting guide to open up the femoral canal.  I then used a size 8 and followed by size 9 Corail broach and the 9 Corail broach was actually very tight.  I trialed a varus neck and due to his offset and a 36+ 1.5 hip ball.  We brought the leg back over and up with the traction and internal rotation, reduced this in the acetabulum and it was stable with internal and external rotation with minimal shuck and his leg lengths were measured near equal under fluoroscopy.  We then dislocated the hip and removed the trial components.  We placed the real Corail femoral component size 9 with varus offset and the real 36+ 1.5 ceramic hip ball.  We brought the leg back over and up with traction and internal rotation, reduced the hip again and it was stable.  We used pulsatile lavage to copiously irrigate the hip joint.  We closed the joint capsule with interrupted #1 Ethibond suture, followed by 2-0 running V-Loc and the tensor fascia, 2-0 Vicryl in the deep and subcutaneous tissue, 4-0 Monocryl subcuticular and Dermabond on the skin, and a Aquacel dressing was then applied.  He was taken off the Hana table into the recovery room in stable condition. All final counts were correct.  There  were no complications noted.     Vanita Panda. Magnus Ivan, M.D.     CYB/MEDQ  D:  10/05/2012  T:  10/06/2012  Job:  [id number]

## 2012-10-06 NOTE — Progress Notes (Signed)
Physical Therapy Treatment Patient Details Name: Keeyon Privitera MRN: [id number] DOB: [birth year] Today's Date: 10/06/2012 Time: 1478-2956 PT Time Calculation (min): 34 min  PT Assessment / Plan / Recommendation Comments on Treatment Session       Follow Up Recommendations  Home health PT     Does the patient have the potential to tolerate intense rehabilitation     Barriers to Discharge        Equipment Recommendations  Rolling walker with 5" wheels    Recommendations for Other Services OT consult  Frequency 7X/week   Plan Discharge plan remains appropriate    Precautions / Restrictions Precautions Precautions: Fall Restrictions Weight Bearing Restrictions: No Other Position/Activity Restrictions: WBAT   Pertinent Vitals/Pain 3/10; premed, ice pack provided    Mobility  Bed Mobility Bed Mobility: Supine to Sit Supine to Sit: 4: Min assist;3: Mod assist Details for Bed Mobility Assistance: cues for sequence and use of L LE to self assist Transfers Transfers: Sit to Stand;Stand to Sit Sit to Stand: 4: Min assist;3: Mod assist Stand to Sit: 4: Min assist;3: Mod assist Details for Transfer Assistance: cues for LE management and use of UEs to self assist Ambulation/Gait Ambulation/Gait Assistance: 4: Min assist;3: Mod assist Ambulation Distance (Feet): 70 Feet Assistive device: Rolling walker Ambulation/Gait Assistance Details: cues for sequence, posture, stride length and position from RW - assist for support/balance and initially to advance R LE Gait Pattern: Step-to pattern;Decreased step length - right;Decreased step length - left    Exercises Total Joint Exercises Ankle Circles/Pumps: AROM;Supine;Both;20 reps Quad Sets: AROM;Both;10 reps;Supine Gluteal Sets: AROM;10 reps;Both;Supine Heel Slides: AAROM;Supine;Right;15 reps Hip ABduction/ADduction: AAROM;Supine;Right;15 reps   PT Diagnosis:    PT Problem List:   PT Treatment Interventions:     PT  Goals Acute Rehab PT Goals PT Goal Formulation: With patient Time For Goal Achievement: 10/10/12 Potential to Achieve Goals: Good Pt will go Supine/Side to Sit: with supervision PT Goal: Supine/Side to Sit - Progress: Progressing toward goal Pt will go Sit to Supine/Side: with supervision PT Goal: Sit to Supine/Side - Progress: Progressing toward goal Pt will go Sit to Stand: with supervision PT Goal: Sit to Stand - Progress: Progressing toward goal Pt will go Stand to Sit: with supervision PT Goal: Stand to Sit - Progress: Progressing toward goal Pt will Ambulate: >150 feet;with supervision;with rolling walker PT Goal: Ambulate - Progress: Progressing toward goal Pt will Go Up / Down Stairs: 3-5 stairs;with min assist;with least restrictive assistive device  Visit Information  Last PT Received On: 10/06/12 Assistance Needed: +1    Subjective Data  Subjective: I did better yesterday - its just so stiff Patient Stated Goal: Resume previous lifestyle with decreased pain   Cognition  Cognition Overall Cognitive Status: Appears within functional limits for tasks assessed/performed Arousal/Alertness: Awake/alert Orientation Level: Appears intact for tasks assessed Behavior During Session: [facility] for tasks performed    Balance     End of Session PT - End of Session Equipment Utilized During Treatment: Gait belt Activity Tolerance: Patient tolerated treatment well Patient left: with call bell/phone within reach;with family/visitor present;in chair Nurse Communication: Mobility status   GP     BRADSHAW,HUNTER 10/06/2012, 9:20 AM

## 2012-10-07 LAB — CBC
HCT: 32.4 % — ABNORMAL LOW (ref 39.0–52.0)
MCH: 33 pg (ref 26.0–34.0)
MCHC: 34.3 g/dL (ref 30.0–36.0)
RDW: 12.1 % (ref 11.5–15.5)

## 2012-10-07 MED ORDER — OXYCODONE-ACETAMINOPHEN 5-325 MG PO TABS: 1.0000 | ORAL_TABLET | ORAL | Status: DC | PRN

## 2012-10-07 MED ORDER — ASPIRIN 325 MG PO TABS: 325.0000 mg | ORAL_TABLET | Freq: Two times a day (BID) | ORAL | Status: DC

## 2012-10-07 MED ORDER — METHOCARBAMOL 500 MG PO TABS: 500.0000 mg | ORAL_TABLET | Freq: Four times a day (QID) | ORAL | Status: DC | PRN

## 2012-10-07 NOTE — Care Management Note (Signed)
    Page 1 of 2   10/07/2012     4:06:14 PM   CARE MANAGEMENT NOTE 10/07/2012  Patient:  KRESTON, AHRENDT   Account Number:  [id number]  Date Initiated:  10/07/2012  Documentation initiated by:  Lanier Clam  Subjective/Objective Assessment:   ADMITTED W/OA R HIP.     Action/Plan:   FROM HOME.   Anticipated DC Date:  10/07/2012   Anticipated DC Plan:  HOME W HOME HEALTH SERVICES      DC Planning Services  CM consult      Choice offered to / List presented to:  C-1 Patient   DME arranged  Levan Hurst      DME agency  [facility]     HH arranged  HH-2 PT      [facility] agency  [facility]   Status of service:  Completed, signed off Medicare Important Message given?   (If response is "NO", the following Medicare IM given date fields will be blank) Date Medicare IM given:   Date Additional Medicare IM given:    Discharge Disposition:  HOME W HOME HEALTH SERVICES  Per UR Regulation:  Reviewed for med. necessity/level of care/duration of stay  If discussed at Long Length of Stay Meetings, dates discussed:    Comments:  10/07/12 KATHY MAHABIR RN,BSN NCM [phone] GENTIVA LAURIE(REP) ALREADY FOLLOWING FOR HHPT,AWARE OF D/C TODAY,& HHPT ORDER.[facility] DME (JERMAINE) REP PROVIDED RW TO RM.

## 2012-10-07 NOTE — Progress Notes (Signed)
Physical Therapy Treatment Patient Details Name: Patrick Rivas MRN: [id number] DOB: June 24, 1951 Today's Date: 10/07/2012 Time: 4098-1191 PT Time Calculation (min): 28 min  PT Assessment / Plan / Recommendation Comments on Treatment Session  Pt is pleasant and motiviated. R knee and hip are grossly weak (+2/5)  and pt walks with trunk flexed, both of which are pre-existing per pt & wife. He has progressed well with mobility. OK to DC home with HHPT from PT standpoint.     Follow Up Recommendations  Home health PT     Does the patient have the potential to tolerate intense rehabilitation     Barriers to Discharge        Equipment Recommendations  Rolling walker with 5" wheels    Recommendations for Other Services OT consult  Frequency 7X/week   Plan Discharge plan remains appropriate    Precautions / Restrictions Precautions Precautions: Fall Restrictions Weight Bearing Restrictions: No Other Position/Activity Restrictions: WBAT   Pertinent Vitals/Pain **3/10 R hip Ice applied, pain meds requested*    Mobility  Bed Mobility Bed Mobility: Not assessed Sit to Supine: 4: Min assist Details for Bed Mobility Assistance: assist for RLE Transfers Transfers: Sit to Stand;Stand to Sit Sit to Stand: 4: Min guard;From bed;5: Supervision;With upper extremity assist Stand to Sit: 4: Min guard;To chair/3-in-1;With upper extremity assist;With armrests Details for Transfer Assistance: cues for hand placement Ambulation/Gait Ambulation/Gait Assistance: 4: Min guard Ambulation Distance (Feet): 250 Feet Assistive device: Rolling walker Ambulation/Gait Assistance Details: verbal cues for flexed trunk/neck posture, heel strike on R Gait Pattern: Step-to pattern;Step-through pattern;Decreased step length - left;Decreased stance time - right;Right hip hike Stairs: Yes Stairs Assistance: 4: Min assist Stairs Assistance Details (indicate cue type and reason): wife present for stair  training, she stabilized RW  Stair Management Technique: Step to pattern;Backwards;No rails;With walker Number of Stairs: 4    Exercises Total Joint Exercises Ankle Circles/Pumps: AROM;Supine;Both;20 reps Quad Sets: AROM;Both;10 reps;Supine Short Arc Quad: AAROM;Right;10 reps;Supine Heel Slides: AAROM;Supine;Right;15 reps Hip ABduction/ADduction: AAROM;Supine;Right;15 reps Long Arc Quad: AAROM;15 reps;Right;Seated   PT Diagnosis:    PT Problem List:   PT Treatment Interventions:     PT Goals Acute Rehab PT Goals PT Goal Formulation: With patient Time For Goal Achievement: 10/10/12 Potential to Achieve Goals: Good Pt will go Supine/Side to Sit: with supervision Pt will go Sit to Supine/Side: with supervision PT Goal: Sit to Supine/Side - Progress: Progressing toward goal Pt will go Sit to Stand: with supervision PT Goal: Sit to Stand - Progress: Progressing toward goal Pt will go Stand to Sit: with supervision PT Goal: Stand to Sit - Progress: Progressing toward goal Pt will Ambulate: >150 feet;with supervision;with rolling walker PT Goal: Ambulate - Progress: Progressing toward goal Pt will Go Up / Down Stairs: 3-5 stairs;with min assist;with least restrictive assistive device PT Goal: Up/Down Stairs - Progress: Met  Visit Information  Last PT Received On: 10/07/12 Assistance Needed: +1    Subjective Data  Subjective: I'm ready to go home.  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   Cognition  Cognition Overall Cognitive Status: Appears within functional limits for tasks assessed/performed Arousal/Alertness: Awake/alert Orientation Level: Appears intact for tasks assessed Behavior During Session: [facility] for tasks performed    Balance     End of Session PT - End of Session Equipment Utilized During Treatment: Gait belt Activity Tolerance: Patient tolerated treatment well Patient left: with call bell/phone within reach;with family/visitor present;in chair Nurse  Communication: Mobility status   GP  Ralene Bathe Kistler 10/07/2012, 10:17 AM

## 2012-10-07 NOTE — Progress Notes (Signed)
Patient ID: Patrick Rivas, male   DOB: 27-Feb-1951, 62 y.o.   MRN: [id number] Looks good.  Can discharge to home today.

## 2012-10-07 NOTE — Discharge Summary (Signed)
Patient ID: Patrick Rivas MRN: [id number] DOB/AGE: 08-17-1950 62 y.o.  Admit date: 10/05/2012 Discharge date: 10/07/2012  Admission Diagnoses:  Principal Problem:   Degenerative arthritis of hip   Discharge Diagnoses:  Same  Past Medical History  Diagnosis Date  . Heart murmur   . Arthritis     Osteoarthritis-rt. hip, back(degenerative disc)  . Hepatitis     50  yrs ago" mild"-no problems now    Surgeries: Procedure(s): Right TOTAL HIP ARTHROPLASTY ANTERIOR APPROACH on 10/05/2012   Consultants:    Discharged Condition: Improved  Hospital Course: Patrick Rivas is an 62 y.o. male who was admitted 10/05/2012 for operative treatment ofDegenerative arthritis of hip. Patient has severe unremitting pain that affects sleep, daily activities, and work/hobbies. After pre-op clearance the patient was taken to the operating room on 10/05/2012 and underwent  Procedure(s): Right TOTAL HIP ARTHROPLASTY ANTERIOR APPROACH.    Patient was given perioperative antibiotics: Anti-infectives   Start     Dose/Rate Route Frequency Ordered Stop   10/05/12 1400  ceFAZolin (ANCEF) IVPB 1 g/50 mL premix     1 g 100 mL/hr over 30 Minutes Intravenous Every 6 hours 10/05/12 1124 10/05/12 2108   10/05/12 0527  ceFAZolin (ANCEF) IVPB 2 g/50 mL premix  Status:  Discontinued     2 g 100 mL/hr over 30 Minutes Intravenous On call to O.R. 10/05/12 0527 10/05/12 1120       Patient was given sequential compression devices, early ambulation, and chemoprophylaxis to prevent DVT.  Patient benefited maximally from hospital stay and there were no complications.    Recent vital signs: Patient Vitals for the past 24 hrs:  BP Temp Temp src Pulse Resp SpO2  10/07/12 0500 121/76 mmHg 98.3 F (36.8 C) Oral 84 18 97 %  10/06/12 2346 - 99.5 F (37.5 C) Oral - - -  10/06/12 2100 123/75 mmHg 100.2 F (37.9 C) Oral 89 18 97 %  10/06/12 1832 - 99.5 F (37.5 C) - - - -  10/06/12 1713 - 100.4 F (38 C) Oral - - -   10/06/12 1616 - 101 F (38.3 C) Oral 85 16 100 %  10/06/12 1519 - 101 F (38.3 C) Oral - - -  10/06/12 1411 116/68 mmHg 100.7 F (38.2 C) Oral 85 16 97 %     Recent laboratory studies:  Recent Labs  10/06/12 0522 10/07/12 0424  WBC 5.1 6.2  HGB 10.9* 11.1*  HCT 32.0* 32.4*  PLT 171 164  NA 137  --   K 4.2  --   CL 105  --   CO2 27  --   BUN 11  --   CREATININE 0.97  --   GLUCOSE 107*  --   CALCIUM 8.1*  --      Discharge Medications:     Medication List    STOP taking these medications       ampicillin 250 MG capsule  Commonly known as:  PRINCIPEN      TAKE these medications       aspirin 325 MG tablet  Take 1 tablet (325 mg total) by mouth 2 (two) times daily after a meal. 5 times per week     B-complex with vitamin C tablet  Take 1 tablet by mouth daily.     cholecalciferol 1000 UNITS tablet  Commonly known as:  VITAMIN D  Take 1,000 Units by mouth daily.     diclofenac 75 MG EC tablet  Commonly known as:  VOLTAREN  Take 75 mg by mouth 2 (two) times daily.     fish oil-omega-3 fatty acids 1000 MG capsule  Take 1 g by mouth daily.     glucosamine-chondroitin 500-400 MG tablet  Take 2 tablets by mouth 2 (two) times daily.     methocarbamol 500 MG tablet  Commonly known as:  ROBAXIN  Take 1 tablet (500 mg total) by mouth every 6 (six) hours as needed.     oxyCODONE-acetaminophen 5-325 MG per tablet  Commonly known as:  ROXICET  Take 1-2 tablets by mouth every 4 (four) hours as needed for pain.     traMADol 50 MG tablet  Commonly known as:  ULTRAM  Take 50 mg by mouth every 6 (six) hours as needed for pain.        Diagnostic Studies: Dg Hip Complete Right  10/05/2012  *RADIOLOGY REPORT*  Clinical Data: Right hip replacement  RIGHT HIP - COMPLETE 2+ VIEW  Comparison: None.  Findings: Right total hip arthroplasty is in place.  No breakage or loosening of the hardware.  Anatomic alignment.  Linear opacity projects over the upper medial right  thigh soft tissues.  Foreign body is not excluded.  IMPRESSION: Anatomically aligned the total hip arthroplasty.  A radiopaque linear foreign body projects over the medial thigh soft tissues.   Original Report Authenticated By: Jolaine Click, M.D.    Dg Pelvis Portable  10/05/2012  *RADIOLOGY REPORT*  Clinical Data: Post right hip surgery  PORTABLE PELVIS  Comparison: Portable exam 1034 hours compared to intraoperative C- arm fluoroscopic images of 10/05/2012  Findings: Acetabular and femoral components of a right hip prosthesis are identified in expected positions. No fracture or dislocation. No acute complications seen. Left pelvic phleboliths noted. Previously questioned radiopaque foreign body at medial right thigh no longer identified.  IMPRESSION: Right hip prosthesis without evidence of acute complication.   Original Report Authenticated By: Ulyses Southward, M.D.    Dg Hip Portable 1 View Right  10/05/2012  *RADIOLOGY REPORT*  Clinical Data: Post right hip surgery  PORTABLE RIGHT HIP - 1 VIEW  Comparison: Portable exam 1036 hours compared to earlier intraoperative C-arm fluoroscopic images of 10/05/2012  Findings: Acetabular and femoral components of a right hip prosthesis are identified. No fracture or dislocation seen. Bones appear demineralized.  IMPRESSION: Right hip prosthesis without evidence of acute complication.   Original Report Authenticated By: Ulyses Southward, M.D.    Dg C-arm 61-120 Min-no Report  10/05/2012  CLINICAL DATA: intra op   C-ARM 61-120 MINUTES  Fluoroscopy was utilized by the requesting physician.  No radiographic  interpretation.      Disposition:  To home      Discharge Orders   Future Orders Complete By Expires     Call MD / Call 911  As directed     Comments:      If you experience chest pain or shortness of breath, CALL 911 and be transported to the hospital emergency room.  If you develope a fever above 101 F, pus (white drainage) or increased drainage or redness at the  wound, or calf pain, call your surgeon's office.    Constipation Prevention  As directed     Comments:      Drink plenty of fluids.  Prune juice may be helpful.  You may use a stool softener, such as Colace (over the counter) 100 mg twice a day.  Use MiraLax (over the counter) for constipation as needed.    Diet -  low sodium heart healthy  As directed     Discharge instructions  As directed     Comments:      Increase activities as comfort allows. Expect thigh, leg, and foot swelling. You can get your actual incision wet in the shower starting 10/11/12; then dry dressing daily after that.    Discharge patient  As directed     Increase activity slowly as tolerated  As directed        Follow-up Information   Follow up with Kathryne Hitch, MD In [address] [place].   Contact information:   [address] [address] [place] [address] [phone]        Signed: Kathryne Hitch 10/07/2012, 10:03 AM

## 2012-10-08 ENCOUNTER — Encounter (HOSPITAL_COMMUNITY): Payer: Self-pay | Admitting: Orthopaedic Surgery

## 2013-07-08 ENCOUNTER — Other Ambulatory Visit: Payer: Self-pay | Admitting: Family Medicine

## 2013-07-08 DIAGNOSIS — R0989 Other specified symptoms and signs involving the circulatory and respiratory systems: Secondary | ICD-10-CM

## 2013-07-15 ENCOUNTER — Ambulatory Visit
Admission: RE | Admit: 2013-07-15 | Discharge: 2013-07-15 | Disposition: A | Discharge status: Home / Self Care | Payer: BC Managed Care – PPO | Source: Ambulatory Visit | Attending: Family Medicine | Admitting: Family Medicine

## 2013-07-15 DIAGNOSIS — R0989 Other specified symptoms and signs involving the circulatory and respiratory systems: Secondary | ICD-10-CM

## 2013-09-23 NOTE — Progress Notes (Signed)
Surgery scheduled for 10/04/13.  Preop 3/2 at 0830am.  Need orders in EPIC.  Thank You.

## 2013-09-25 ENCOUNTER — Encounter (HOSPITAL_COMMUNITY): Payer: Self-pay | Admitting: Pharmacy Technician

## 2013-09-30 ENCOUNTER — Inpatient Hospital Stay (HOSPITAL_COMMUNITY): Admission: RE | Admit: 2013-09-30 | Payer: BC Managed Care – PPO | Source: Ambulatory Visit

## 2013-10-04 ENCOUNTER — Inpatient Hospital Stay (HOSPITAL_COMMUNITY)
Admission: RE | Admit: 2013-10-04 | Payer: BC Managed Care – PPO | Source: Ambulatory Visit | Admitting: Orthopaedic Surgery

## 2013-10-04 ENCOUNTER — Encounter (HOSPITAL_COMMUNITY): Admission: RE | Payer: Self-pay | Source: Ambulatory Visit

## 2013-10-04 SURGERY — ARTHROPLASTY, HIP, TOTAL, ANTERIOR APPROACH
Anesthesia: Choice | Site: Hip | Laterality: Left

## 2014-02-24 ENCOUNTER — Other Ambulatory Visit (HOSPITAL_COMMUNITY): Payer: Self-pay | Admitting: Orthopaedic Surgery

## 2014-03-14 ENCOUNTER — Encounter (HOSPITAL_COMMUNITY): Payer: Self-pay | Admitting: Pharmacy Technician

## 2014-03-17 ENCOUNTER — Encounter (HOSPITAL_COMMUNITY)
Admission: RE | Admit: 2014-03-17 | Discharge: 2014-03-17 | Disposition: A | Discharge status: Home / Self Care | Payer: BC Managed Care – PPO | Source: Ambulatory Visit | Attending: Orthopaedic Surgery | Admitting: Orthopaedic Surgery

## 2014-03-17 ENCOUNTER — Encounter (HOSPITAL_COMMUNITY): Payer: Self-pay

## 2014-03-17 DIAGNOSIS — M169 Osteoarthritis of hip, unspecified: Secondary | ICD-10-CM | POA: Insufficient documentation

## 2014-03-17 DIAGNOSIS — Z01818 Encounter for other preprocedural examination: Secondary | ICD-10-CM | POA: Diagnosis present

## 2014-03-17 DIAGNOSIS — M1610 Unilateral primary osteoarthritis, unspecified hip: Secondary | ICD-10-CM | POA: Diagnosis present

## 2014-03-17 LAB — URINALYSIS, ROUTINE W REFLEX MICROSCOPIC
Bilirubin Urine: NEGATIVE
GLUCOSE, UA: NEGATIVE mg/dL
Hgb urine dipstick: NEGATIVE
KETONES UR: NEGATIVE mg/dL
NITRITE: NEGATIVE
PH: 5.5 (ref 5.0–8.0)
Protein, ur: NEGATIVE mg/dL
Specific Gravity, Urine: 1.018 (ref 1.005–1.030)
Urobilinogen, UA: 0.2 mg/dL (ref 0.0–1.0)

## 2014-03-17 LAB — CBC
HCT: 43.1 % (ref 39.0–52.0)
HEMOGLOBIN: 14.6 g/dL (ref 13.0–17.0)
MCH: 33.1 pg (ref 26.0–34.0)
MCHC: 33.9 g/dL (ref 30.0–36.0)
MCV: 97.7 fL (ref 78.0–100.0)
Platelets: 204 10*3/uL (ref 150–400)
RBC: 4.41 MIL/uL (ref 4.22–5.81)
RDW: 12.5 % (ref 11.5–15.5)
WBC: 3.9 10*3/uL — AB (ref 4.0–10.5)

## 2014-03-17 LAB — BASIC METABOLIC PANEL
Anion gap: 8 (ref 5–15)
BUN: 13 mg/dL (ref 6–23)
CHLORIDE: 105 meq/L (ref 96–112)
CO2: 29 mEq/L (ref 19–32)
Calcium: 10.2 mg/dL (ref 8.4–10.5)
Creatinine, Ser: 0.95 mg/dL (ref 0.50–1.35)
GFR calc non Af Amer: 88 mL/min — ABNORMAL LOW (ref >90)
GLUCOSE: 84 mg/dL (ref 70–99)
Potassium: 5.3 mEq/L (ref 3.7–5.3)
SODIUM: 142 meq/L (ref 137–147)

## 2014-03-17 LAB — URINE MICROSCOPIC-ADD ON

## 2014-03-17 LAB — SURGICAL PCR SCREEN
MRSA, PCR: NEGATIVE
Staphylococcus aureus: NEGATIVE

## 2014-03-17 LAB — PROTIME-INR
INR: 0.95 (ref 0.00–1.49)
PROTHROMBIN TIME: 12.7 s (ref 11.6–15.2)

## 2014-03-17 LAB — APTT: aPTT: 36 seconds (ref 24–37)

## 2014-03-17 NOTE — Patient Instructions (Signed)
YOUR SURGERY IS SCHEDULED AT [facility]  ON:   Friday 8/21  REPORT TO  SHORT STAY CENTER AT:  5:30 AM   PLEASE COME IN THE [facility] MAIN HOSPITAL ENTRANCE AND FOLLOW SIGNS TO SHORT STAY CENTER.  DO NOT EAT OR DRINK ANYTHING AFTER MIDNIGHT THE NIGHT BEFORE YOUR SURGERY.  YOU MAY BRUSH YOUR TEETH, RINSE OUT YOUR MOUTH--BUT NO WATER, NO FOOD, NO CHEWING GUM, NO MINTS, NO CANDIES, NO CHEWING TOBACCO.  PLEASE TAKE THE FOLLOWING MEDICATIONS THE AM OF YOUR SURGERY WITH A FEW SIPS OF WATER:  NO MEDICATIONS TO TAKE   DO NOT BRING VALUABLES, MONEY, CREDIT CARDS.  DO NOT WEAR JEWELRY, MAKE-UP, NAIL POLISH AND NO METAL PINS OR CLIPS IN YOUR HAIR. CONTACT LENS, DENTURES / PARTIALS, GLASSES SHOULD NOT BE WORN TO SURGERY AND IN MOST CASES-HEARING AIDS WILL NEED TO BE REMOVED.  BRING YOUR GLASSES CASE, ANY EQUIPMENT NEEDED FOR YOUR CONTACT LENS. FOR PATIENTS ADMITTED TO THE HOSPITAL--CHECK OUT TIME THE DAY OF DISCHARGE IS 11:00 AM.  ALL INPATIENT ROOMS ARE PRIVATE - WITH BATHROOM, TELEPHONE, TELEVISION AND WIFI INTERNET.  I                                                  PLEASE READ OVER ANY  FACT SHEETS THAT YOU WERE GIVEN: MRSA INFORMATION, BLOOD TRANSFUSION INFORMATION, INCENTIVE SPIROMETER INFORMATION.  PLEASE BE AWARE THAT YOU MAY NEED ADDITIONAL BLOOD DRAWN DAY OF YOUR SURGERY  _______________________________________________________________________   [facility] - Preparing for Surgery Before surgery, you can play an important role.  Because skin is not sterile, your skin needs to be as free of germs as possible.  You can reduce the number of germs on your skin by washing with CHG (chlorahexidine gluconate) soap before surgery.  CHG is an antiseptic cleaner which kills germs and bonds with the skin to continue killing germs even after washing. Please DO NOT use if you have an allergy to CHG or antibacterial soaps.  If your skin becomes reddened/irritated stop using the CHG and  inform your nurse when you arrive at Short Stay. Do not shave (including legs and underarms) for at least 48 hours prior to the first CHG shower.  You may shave your face/neck. Please follow these instructions carefully:  1.  Shower with CHG Soap the night before surgery and the  morning of Surgery.  2.  If you choose to wash your hair, wash your hair first as usual with your  normal  shampoo.  3.  After you shampoo, rinse your hair and body thoroughly to remove the  shampoo.                           4.  Use CHG as you would any other liquid soap.  You can apply chg directly  to the skin and wash                       Gently with a scrungie or clean washcloth.  5.  Apply the CHG Soap to your body ONLY FROM THE NECK DOWN.   Do not use on face/ open  Wound or open sores. Avoid contact with eyes, ears mouth and genitals (private parts).                       Wash face,  Genitals (private parts) with your normal soap.             6.  Wash thoroughly, paying special attention to the area where your surgery  will be performed.  7.  Thoroughly rinse your body with warm water from the neck down.  8.  DO NOT shower/wash with your normal soap after using and rinsing off  the CHG Soap.                9.  Pat yourself dry with a clean towel.            10.  Wear clean pajamas.            11.  Place clean sheets on your bed the night of your first shower and do not  sleep with pets. Day of Surgery : Do not apply any lotions/deodorants the morning of surgery.  Please wear clean clothes to the hospital/surgery center.  FAILURE TO FOLLOW THESE INSTRUCTIONS MAY RESULT IN THE CANCELLATION OF YOUR SURGERY PATIENT SIGNATURE_________________________________  NURSE SIGNATURE__________________________________  ________________________________________________________________________  WHAT IS A BLOOD TRANSFUSION? Blood Transfusion Information  A transfusion is the replacement of blood or  some of its parts. Blood is made up of multiple cells which provide different functions.  Red blood cells carry oxygen and are used for blood loss replacement.  White blood cells fight against infection.  Platelets control bleeding.  Plasma helps clot blood.  Other blood products are available for specialized needs, such as hemophilia or other clotting disorders. BEFORE THE TRANSFUSION  Who gives blood for transfusions?   Healthy volunteers who are fully evaluated to make sure their blood is safe. This is blood bank blood. Transfusion therapy is the safest it has ever been in the practice of medicine. Before blood is taken from a donor, a complete history is taken to make sure that person has no history of diseases nor engages in risky social behavior (examples are intravenous drug use or sexual activity with multiple partners). The donor's travel history is screened to minimize risk of transmitting infections, such as malaria. The donated blood is tested for signs of infectious diseases, such as HIV and hepatitis. The blood is then tested to be sure it is compatible with you in order to minimize the chance of a transfusion reaction. If you or a relative donates blood, this is often done in anticipation of surgery and is not appropriate for emergency situations. It takes many days to process the donated blood. RISKS AND COMPLICATIONS Although transfusion therapy is very safe and saves many lives, the main dangers of transfusion include:   Getting an infectious disease.  Developing a transfusion reaction. This is an allergic reaction to something in the blood you were given. Every precaution is taken to prevent this. The decision to have a blood transfusion has been considered carefully by your caregiver before blood is given. Blood is not given unless the benefits outweigh the risks. AFTER THE TRANSFUSION  Right after receiving a blood transfusion, you will usually feel much better and more  energetic. This is especially true if your red blood cells have gotten low (anemic). The transfusion raises the level of the red blood cells which carry oxygen, and this usually causes an energy increase.  The nurse administering the transfusion will monitor you carefully for complications. HOME CARE INSTRUCTIONS  No special instructions are needed after a transfusion. You may find your energy is better. Speak with your caregiver about any limitations on activity for underlying diseases you may have. SEEK MEDICAL CARE IF:   Your condition is not improving after your transfusion.  You develop redness or irritation at the intravenous (IV) site. SEEK IMMEDIATE MEDICAL CARE IF:  Any of the following symptoms occur over the next 12 hours:  Shaking chills.  You have a temperature by mouth above 102 F (38.9 C), not controlled by medicine.  Chest, back, or muscle pain.  People around you feel you are not acting correctly or are confused.  Shortness of breath or difficulty breathing.  Dizziness and fainting.  You get a rash or develop hives.  You have a decrease in urine output.  Your urine turns a dark color or changes to pink, red, or [place]. Any of the following symptoms occur over the next 10 days:  You have a temperature by mouth above 102 F (38.9 C), not controlled by medicine.  Shortness of breath.  Weakness after normal activity.  The white part of the eye turns yellow (jaundice).  You have a decrease in the amount of urine or are urinating less often.  Your urine turns a dark color or changes to pink, red, or [place]. Document Released: 07/15/2000 Document Revised: 10/10/2011 Document Reviewed: 03/03/2008 [facility] Patient Information 2014 [place], [place].  _______________________________________________________________________

## 2014-03-17 NOTE — Pre-Procedure Instructions (Signed)
EKG AND CXR NOT NEEDED PER ANESTHESIOLOGIST'S GUIDELINES. 

## 2014-03-21 ENCOUNTER — Inpatient Hospital Stay (HOSPITAL_COMMUNITY): Payer: BC Managed Care – PPO

## 2014-03-21 ENCOUNTER — Encounter (HOSPITAL_COMMUNITY): Payer: BC Managed Care – PPO | Admitting: Certified Registered Nurse Anesthetist

## 2014-03-21 ENCOUNTER — Inpatient Hospital Stay (HOSPITAL_COMMUNITY)
Admission: RE | Admit: 2014-03-21 | Discharge: 2014-03-23 | DRG: 470 | Disposition: A | Discharge status: Home Health | Payer: BC Managed Care – PPO | Source: Ambulatory Visit | Attending: Orthopaedic Surgery | Admitting: Orthopaedic Surgery

## 2014-03-21 ENCOUNTER — Encounter (HOSPITAL_COMMUNITY): Payer: Self-pay | Admitting: *Deleted

## 2014-03-21 ENCOUNTER — Encounter (HOSPITAL_COMMUNITY)
Admission: RE | Disposition: A | Discharge status: Home Health | Payer: Self-pay | Source: Ambulatory Visit | Attending: Orthopaedic Surgery

## 2014-03-21 ENCOUNTER — Inpatient Hospital Stay (HOSPITAL_COMMUNITY): Payer: BC Managed Care – PPO | Admitting: Certified Registered Nurse Anesthetist

## 2014-03-21 DIAGNOSIS — Z6829 Body mass index (BMI) 29.0-29.9, adult: Secondary | ICD-10-CM | POA: Diagnosis not present

## 2014-03-21 DIAGNOSIS — Z9849 Cataract extraction status, unspecified eye: Secondary | ICD-10-CM

## 2014-03-21 DIAGNOSIS — Z01812 Encounter for preprocedural laboratory examination: Secondary | ICD-10-CM

## 2014-03-21 DIAGNOSIS — Z96642 Presence of left artificial hip joint: Secondary | ICD-10-CM

## 2014-03-21 DIAGNOSIS — M1610 Unilateral primary osteoarthritis, unspecified hip: Principal | ICD-10-CM | POA: Diagnosis present

## 2014-03-21 DIAGNOSIS — Z79899 Other long term (current) drug therapy: Secondary | ICD-10-CM | POA: Diagnosis not present

## 2014-03-21 DIAGNOSIS — M1612 Unilateral primary osteoarthritis, left hip: Secondary | ICD-10-CM

## 2014-03-21 DIAGNOSIS — M25559 Pain in unspecified hip: Secondary | ICD-10-CM | POA: Diagnosis present

## 2014-03-21 DIAGNOSIS — Z87891 Personal history of nicotine dependence: Secondary | ICD-10-CM | POA: Diagnosis not present

## 2014-03-21 DIAGNOSIS — M169 Osteoarthritis of hip, unspecified: Secondary | ICD-10-CM | POA: Diagnosis present

## 2014-03-21 DIAGNOSIS — Z96649 Presence of unspecified artificial hip joint: Secondary | ICD-10-CM | POA: Diagnosis not present

## 2014-03-21 HISTORY — PX: TOTAL HIP ARTHROPLASTY: SHX124

## 2014-03-21 LAB — TYPE AND SCREEN
ABO/RH(D): O POS
Antibody Screen: NEGATIVE

## 2014-03-21 SURGERY — ARTHROPLASTY, HIP, TOTAL, ANTERIOR APPROACH
Anesthesia: Spinal | Site: Hip | Laterality: Left

## 2014-03-21 MED ORDER — ONDANSETRON HCL 4 MG/2ML IJ SOLN
4.0000 mg | Freq: Four times a day (QID) | INTRAMUSCULAR | Status: DC | PRN
Start: 2014-03-21 — End: 2014-03-23

## 2014-03-21 MED ORDER — PROMETHAZINE HCL 25 MG/ML IJ SOLN: 6.2500 mg | INTRAMUSCULAR | Status: DC | PRN

## 2014-03-21 MED ORDER — HYDROMORPHONE HCL PF 1 MG/ML IJ SOLN
1.0000 mg | INTRAMUSCULAR | Status: DC | PRN
  Administered 2014-03-21: 1 mg via INTRAVENOUS
  Filled 2014-03-21: qty 1

## 2014-03-21 MED ORDER — PROPOFOL 10 MG/ML IV BOLUS
INTRAVENOUS | Status: AC
  Filled 2014-03-21: qty 20

## 2014-03-21 MED ORDER — CEFAZOLIN SODIUM-DEXTROSE 2-3 GM-% IV SOLR
2.0000 g | INTRAVENOUS | Status: AC
  Administered 2014-03-21: 2 g via INTRAVENOUS

## 2014-03-21 MED ORDER — DEXAMETHASONE SODIUM PHOSPHATE 10 MG/ML IJ SOLN
INTRAMUSCULAR | Status: AC
  Filled 2014-03-21: qty 1

## 2014-03-21 MED ORDER — FENTANYL CITRATE 0.05 MG/ML IJ SOLN
INTRAMUSCULAR | Status: DC | PRN
  Administered 2014-03-21: 25 ug via INTRAVENOUS
  Administered 2014-03-21: 50 ug via INTRAVENOUS
  Administered 2014-03-21: 25 ug via INTRAVENOUS
  Administered 2014-03-21: 100 ug via INTRAVENOUS

## 2014-03-21 MED ORDER — ASPIRIN EC 325 MG PO TBEC
325.0000 mg | DELAYED_RELEASE_TABLET | Freq: Two times a day (BID) | ORAL | Status: DC
  Administered 2014-03-21 – 2014-03-23 (×4): 325 mg via ORAL
  Filled 2014-03-21 (×6): qty 1

## 2014-03-21 MED ORDER — METOCLOPRAMIDE HCL 10 MG PO TABS: 5.0000 mg | ORAL_TABLET | Freq: Three times a day (TID) | ORAL | Status: DC | PRN

## 2014-03-21 MED ORDER — EPHEDRINE SULFATE 50 MG/ML IJ SOLN
INTRAMUSCULAR | Status: AC
Start: 2014-03-21 — End: 2014-03-21
  Filled 2014-03-21: qty 1

## 2014-03-21 MED ORDER — TRANEXAMIC ACID 100 MG/ML IV SOLN
1000.0000 mg | INTRAVENOUS | Status: AC
  Administered 2014-03-21: 1000 mg via INTRAVENOUS
  Filled 2014-03-21: qty 10

## 2014-03-21 MED ORDER — LACTATED RINGERS IV SOLN
INTRAVENOUS | Status: DC | PRN
  Administered 2014-03-21 (×2): via INTRAVENOUS

## 2014-03-21 MED ORDER — OXYCODONE HCL 5 MG PO TABS
5.0000 mg | ORAL_TABLET | ORAL | Status: DC | PRN
  Administered 2014-03-21 (×2): 10 mg via ORAL
  Administered 2014-03-21: 5 mg via ORAL
  Administered 2014-03-22 – 2014-03-23 (×10): 10 mg via ORAL
  Filled 2014-03-21: qty 1
  Filled 2014-03-21 (×13): qty 2

## 2014-03-21 MED ORDER — MENTHOL 3 MG MT LOZG
1.0000 | LOZENGE | OROMUCOSAL | Status: DC | PRN
Start: 2014-03-21 — End: 2014-03-23
  Filled 2014-03-21: qty 9

## 2014-03-21 MED ORDER — ONDANSETRON HCL 4 MG/2ML IJ SOLN
INTRAMUSCULAR | Status: AC
  Filled 2014-03-21: qty 2

## 2014-03-21 MED ORDER — ATROPINE SULFATE 0.4 MG/ML IJ SOLN
INTRAMUSCULAR | Status: AC
  Filled 2014-03-21: qty 1

## 2014-03-21 MED ORDER — ZOLPIDEM TARTRATE 5 MG PO TABS: 5.0000 mg | ORAL_TABLET | Freq: Every evening | ORAL | Status: DC | PRN

## 2014-03-21 MED ORDER — FENTANYL CITRATE 0.05 MG/ML IJ SOLN
INTRAMUSCULAR | Status: AC
  Filled 2014-03-21: qty 2

## 2014-03-21 MED ORDER — SODIUM CHLORIDE 0.9 % IR SOLN
Status: DC | PRN
  Administered 2014-03-21: 1000 mL

## 2014-03-21 MED ORDER — ACETAMINOPHEN 650 MG RE SUPP: 650.0000 mg | Freq: Four times a day (QID) | RECTAL | Status: DC | PRN

## 2014-03-21 MED ORDER — SODIUM CHLORIDE 0.9 % IV SOLN
INTRAVENOUS | Status: DC
  Administered 2014-03-21 – 2014-03-22 (×2): via INTRAVENOUS

## 2014-03-21 MED ORDER — HYDROMORPHONE HCL PF 1 MG/ML IJ SOLN: 0.2500 mg | INTRAMUSCULAR | Status: DC | PRN

## 2014-03-21 MED ORDER — EPHEDRINE SULFATE 50 MG/ML IJ SOLN
INTRAMUSCULAR | Status: DC | PRN
  Administered 2014-03-21 (×2): 5 mg via INTRAVENOUS

## 2014-03-21 MED ORDER — ACETAMINOPHEN 10 MG/ML IV SOLN
1000.0000 mg | Freq: Once | INTRAVENOUS | Status: AC
Start: 2014-03-21 — End: 2014-03-21
  Administered 2014-03-21: 1000 mg via INTRAVENOUS
  Filled 2014-03-21: qty 100

## 2014-03-21 MED ORDER — MIDAZOLAM HCL 2 MG/2ML IJ SOLN
INTRAMUSCULAR | Status: AC
  Filled 2014-03-21: qty 2

## 2014-03-21 MED ORDER — PHENYLEPHRINE HCL 10 MG/ML IJ SOLN
INTRAMUSCULAR | Status: DC | PRN
  Administered 2014-03-21 (×2): 40 ug via INTRAVENOUS
  Administered 2014-03-21: 80 ug via INTRAVENOUS
  Administered 2014-03-21: 40 ug via INTRAVENOUS

## 2014-03-21 MED ORDER — POLYETHYLENE GLYCOL 3350 17 G PO PACK: 17.0000 g | PACK | Freq: Every day | ORAL | Status: DC | PRN

## 2014-03-21 MED ORDER — CEFAZOLIN SODIUM-DEXTROSE 2-3 GM-% IV SOLR
INTRAVENOUS | Status: AC
  Filled 2014-03-21: qty 50

## 2014-03-21 MED ORDER — ONDANSETRON HCL 4 MG/2ML IJ SOLN
INTRAMUSCULAR | Status: DC | PRN
  Administered 2014-03-21: 4 mg via INTRAVENOUS

## 2014-03-21 MED ORDER — BUPIVACAINE HCL (PF) 0.5 % IJ SOLN
INTRAMUSCULAR | Status: DC | PRN
  Administered 2014-03-21: 3 mL

## 2014-03-21 MED ORDER — FERROUS SULFATE 325 (65 FE) MG PO TABS
325.0000 mg | ORAL_TABLET | Freq: Three times a day (TID) | ORAL | Status: DC
  Administered 2014-03-21 – 2014-03-23 (×6): 325 mg via ORAL
  Filled 2014-03-21 (×9): qty 1

## 2014-03-21 MED ORDER — MIDAZOLAM HCL 5 MG/5ML IJ SOLN
INTRAMUSCULAR | Status: DC | PRN
  Administered 2014-03-21: 2 mg via INTRAVENOUS

## 2014-03-21 MED ORDER — RISAQUAD PO CAPS
1.0000 | ORAL_CAPSULE | Freq: Every day | ORAL | Status: DC
  Administered 2014-03-22 – 2014-03-23 (×2): 1 via ORAL
  Filled 2014-03-21 (×3): qty 1

## 2014-03-21 MED ORDER — ONDANSETRON HCL 4 MG PO TABS: 4.0000 mg | ORAL_TABLET | Freq: Four times a day (QID) | ORAL | Status: DC | PRN

## 2014-03-21 MED ORDER — METHOCARBAMOL 500 MG PO TABS: 500.0000 mg | ORAL_TABLET | Freq: Four times a day (QID) | ORAL | Status: DC | PRN

## 2014-03-21 MED ORDER — CEFAZOLIN SODIUM-DEXTROSE 2-3 GM-% IV SOLR
2.0000 g | Freq: Four times a day (QID) | INTRAVENOUS | Status: AC
  Administered 2014-03-21 (×2): 2 g via INTRAVENOUS
  Filled 2014-03-21 (×2): qty 50

## 2014-03-21 MED ORDER — PHENYLEPHRINE HCL 10 MG/ML IJ SOLN
INTRAMUSCULAR | Status: AC
  Filled 2014-03-21: qty 1

## 2014-03-21 MED ORDER — METHOCARBAMOL 1000 MG/10ML IJ SOLN
500.0000 mg | Freq: Four times a day (QID) | INTRAVENOUS | Status: DC | PRN
  Filled 2014-03-21: qty 5

## 2014-03-21 MED ORDER — PROPOFOL 10 MG/ML IV BOLUS
INTRAVENOUS | Status: DC | PRN
  Administered 2014-03-21 (×2): 20 mg via INTRAVENOUS
  Administered 2014-03-21: 40 mg via INTRAVENOUS
  Administered 2014-03-21: 20 mg via INTRAVENOUS

## 2014-03-21 MED ORDER — LACTATED RINGERS IV SOLN: INTRAVENOUS | Status: DC

## 2014-03-21 MED ORDER — DOCUSATE SODIUM 100 MG PO CAPS
100.0000 mg | ORAL_CAPSULE | Freq: Two times a day (BID) | ORAL | Status: DC
  Administered 2014-03-21 – 2014-03-23 (×4): 100 mg via ORAL

## 2014-03-21 MED ORDER — PHENOL 1.4 % MT LIQD: 1.0000 | OROMUCOSAL | Status: DC | PRN

## 2014-03-21 MED ORDER — SODIUM CHLORIDE 0.9 % IJ SOLN
INTRAMUSCULAR | Status: AC
  Filled 2014-03-21: qty 10

## 2014-03-21 MED ORDER — DIPHENHYDRAMINE HCL 12.5 MG/5ML PO ELIX: 12.5000 mg | ORAL_SOLUTION | ORAL | Status: DC | PRN

## 2014-03-21 MED ORDER — ATORVASTATIN CALCIUM 10 MG PO TABS
10.0000 mg | ORAL_TABLET | Freq: Every evening | ORAL | Status: DC
  Administered 2014-03-21 – 2014-03-22 (×2): 10 mg via ORAL
  Filled 2014-03-21 (×3): qty 1

## 2014-03-21 MED ORDER — PROPOFOL INFUSION 10 MG/ML OPTIME
INTRAVENOUS | Status: DC | PRN
  Administered 2014-03-21: 50 ug/kg/min via INTRAVENOUS

## 2014-03-21 MED ORDER — PHENYLEPHRINE 40 MCG/ML (10ML) SYRINGE FOR IV PUSH (FOR BLOOD PRESSURE SUPPORT)
PREFILLED_SYRINGE | INTRAVENOUS | Status: AC
  Filled 2014-03-21: qty 10

## 2014-03-21 MED ORDER — ACETAMINOPHEN 325 MG PO TABS: 650.0000 mg | ORAL_TABLET | Freq: Four times a day (QID) | ORAL | Status: DC | PRN

## 2014-03-21 MED ORDER — METOCLOPRAMIDE HCL 5 MG/ML IJ SOLN: 5.0000 mg | Freq: Three times a day (TID) | INTRAMUSCULAR | Status: DC | PRN

## 2014-03-21 SURGICAL SUPPLY — 41 items
BAG ZIPLOCK 12X15 (MISCELLANEOUS) IMPLANT
BENZOIN TINCTURE PRP APPL 2/3 (GAUZE/BANDAGES/DRESSINGS) ×3 IMPLANT
BLADE SAW SGTL 18X1.27X75 (BLADE) ×2 IMPLANT
BLADE SAW SGTL 18X1.27X75MM (BLADE) ×1
CAPT HIP PF COP ×3 IMPLANT
CELLS DAT CNTRL 66122 CELL SVR (MISCELLANEOUS) ×1 IMPLANT
CLOSURE WOUND 1/2 X4 (GAUZE/BANDAGES/DRESSINGS)
COVER PERINEAL POST (MISCELLANEOUS) ×3 IMPLANT
DRAPE C-ARM 42X120 X-RAY (DRAPES) ×3 IMPLANT
DRAPE STERI IOBAN 125X83 (DRAPES) ×3 IMPLANT
DRAPE U-SHAPE 47X51 STRL (DRAPES) ×9 IMPLANT
DRSG AQUACEL AG ADV 3.5X10 (GAUZE/BANDAGES/DRESSINGS) ×3 IMPLANT
DRSG XEROFORM 1X8 (GAUZE/BANDAGES/DRESSINGS) ×3 IMPLANT
DURAPREP 26ML APPLICATOR (WOUND CARE) ×3 IMPLANT
ELECT BLADE TIP CTD 4 INCH (ELECTRODE) ×3 IMPLANT
ELECT REM PT RETURN 9FT ADLT (ELECTROSURGICAL) ×3
ELECTRODE REM PT RTRN 9FT ADLT (ELECTROSURGICAL) ×1 IMPLANT
FACESHIELD WRAPAROUND (MASK) ×12 IMPLANT
GAUZE XEROFORM 1X8 LF (GAUZE/BANDAGES/DRESSINGS) IMPLANT
GLOVE BIO SURGEON STRL SZ7.5 (GLOVE) ×3 IMPLANT
GLOVE BIOGEL PI IND STRL 8 (GLOVE) ×2 IMPLANT
GLOVE BIOGEL PI INDICATOR 8 (GLOVE) ×4
GLOVE ECLIPSE 8.0 STRL XLNG CF (GLOVE) ×3 IMPLANT
GOWN STRL REUS W/TWL XL LVL3 (GOWN DISPOSABLE) ×6 IMPLANT
HANDPIECE INTERPULSE COAX TIP (DISPOSABLE) ×2
KIT BASIN OR (CUSTOM PROCEDURE TRAY) ×3 IMPLANT
PACK TOTAL JOINT (CUSTOM PROCEDURE TRAY) ×3 IMPLANT
RTRCTR WOUND ALEXIS 18CM MED (MISCELLANEOUS) ×3
SET HNDPC FAN SPRY TIP SCT (DISPOSABLE) ×1 IMPLANT
STAPLER VISISTAT 35W (STAPLE) ×3 IMPLANT
STRIP CLOSURE SKIN 1/2X4 (GAUZE/BANDAGES/DRESSINGS) IMPLANT
SUT ETHIBOND NAB CT1 #1 30IN (SUTURE) ×3 IMPLANT
SUT MNCRL AB 4-0 PS2 18 (SUTURE) IMPLANT
SUT VIC AB 0 CT1 36 (SUTURE) ×3 IMPLANT
SUT VIC AB 1 CT1 36 (SUTURE) ×3 IMPLANT
SUT VIC AB 2-0 CT1 27 (SUTURE) ×4
SUT VIC AB 2-0 CT1 TAPERPNT 27 (SUTURE) ×2 IMPLANT
TOWEL OR 17X26 10 PK STRL BLUE (TOWEL DISPOSABLE) ×3 IMPLANT
TOWEL OR NON WOVEN STRL DISP B (DISPOSABLE) ×3 IMPLANT
TRAY FOLEY CATH 14FRSI W/METER (CATHETERS) IMPLANT
TRAY FOLEY METER SIL LF 16FR (CATHETERS) ×3 IMPLANT

## 2014-03-21 NOTE — Anesthesia Postprocedure Evaluation (Signed)
  Anesthesia Post-op Note  Patient: Patrick Rivas  Procedure(s) Performed: Procedure(s): LEFT TOTAL HIP ARTHROPLASTY ANTERIOR APPROACH (Left)  Patient Location: PACU  Anesthesia Type:Spinal  Level of Consciousness: awake, alert  and oriented  Airway and Oxygen Therapy: Patient Spontanous Breathing  Post-op Pain: none  Post-op Assessment: Post-op Vital signs reviewed. Pt able to activate quadriceps and can feel light touch on BLE as evidence of resolution of spinal.  Post-op Vital Signs: Reviewed  Last Vitals:  Filed Vitals:   03/21/14 1045  BP: 105/66  Pulse: 55  Temp:   Resp: 10    Complications: No apparent anesthesia complications

## 2014-03-21 NOTE — Brief Op Note (Signed)
03/21/2014  9:31 AM  PATIENT:  Patrick Rivas  63 y.o. male  PRE-OPERATIVE DIAGNOSIS:  Severe osteoarthritis left hip  POST-OPERATIVE DIAGNOSIS:  Severe osteoarthritis left hip  PROCEDURE:  Procedure(s): LEFT TOTAL HIP ARTHROPLASTY ANTERIOR APPROACH (Left)  SURGEON:  Surgeon(s) and Role:    * Kathryne Hitchhristopher Y Tanisha Lutes, MD - Primary  PHYSICIAN ASSISTANT: Rexene EdisonGil Clark, PA-C  ANESTHESIA:   spinal  EBL:  Total I/O In: 1000 [I.V.:1000] Out: 725 [Urine:325; Blood:400]  BLOOD ADMINISTERED:none  DRAINS: none   LOCAL MEDICATIONS USED:  NONE  SPECIMEN:  No Specimen  DISPOSITION OF SPECIMEN:  N/A  COUNTS:  YES  TOURNIQUET:  * No tourniquets in log *  DICTATION: .Other Dictation: Dictation Number [id number]  PLAN OF CARE: Admit to inpatient   PATIENT DISPOSITION:  PACU - hemodynamically stable.   Delay start of Pharmacological VTE agent (>24hrs) due to surgical blood loss or risk of bleeding: no

## 2014-03-21 NOTE — Op Note (Signed)
NAMESAGE, KOPERA                ACCOUNT NO.:  [id number]  MEDICAL RECORD NO.:  [id number]  LOCATION:  [facility]                         FACILITY:  [facility]  PHYSICIAN:  Vanita Panda. Magnus Ivan, M.D.DATE OF BIRTH:  January 03, 1951  DATE OF PROCEDURE:  03/21/2014 DATE OF DISCHARGE:                              OPERATIVE REPORT   PREOPERATIVE DIAGNOSES:  Severe end-stage arthritis and degenerative joint disease, left hip.  POSTOPERATIVE DIAGNOSES:  Severe end-stage arthritis and degenerative joint disease, left hip.  PROCEDURE:  Left total hip arthroplasty, direct anterior approach.  IMPLANTS:  DePuy Sector Gription acetabular component size 56, apex eliminator guide, size 36+ 4 neutral polyethylene liner, size 11 Corail femoral component with standard offset, size 36+ 5 ceramic hip ball.  SURGEON:  Doneen Poisson, MD.  ASSISTING:  Richardean Canal, PA-C.  ANESTHESIA:  Spinal.  ANTIBIOTICS:  2 g IV Ancef.  BLOOD LOSS:  400 mL.  COMPLICATIONS:  None.  INDICATIONS:  Mr. Toledo is a 63 year old gentleman with bilateral hip severe osteoarthritis.  He underwent a successful total hip arthroplasty on the right side over a year ago.  He now presents for hip surgery on the left side.  He has significant deformity of his left hip.  He has a fresh compression fracture of his knee and x-ray evidence of severe end- stage arthritis.  It is affecting his quality of life, his activities of daily living, and his mobility.  At this point, he does wish to proceed with a total hip arthroplasty on the left given the success of his one on the right.  PROCEDURE DESCRIPTION:  After informed consent was obtained, appropriate left hip was marked.  He was brought to the operating room and spinal anesthesia was obtained while he was on the stretcher.  He was then laid in the supine position on a stretcher.  Foley catheter was placed and both feet had traction boots applied to them.  Next, he was  placed supine on the hana fracture table with the perineal post in place and both legs in inline skeletal traction devices, but no traction applied. We then prepped the left hip with DuraPrep and sterile drapes.  Time-out was called to identify correct patient, correct left hip.  We then made an incision just inferior and posterior to the anterior-superior iliac spine and carried this obliquely down the leg.  We dissected down the tensor fascia lata muscle and tensor fascia was divided longitudinally in order to proceed with a direct anterior approach to the hip.  A Cobra retractor was placed around the lateral neck and up underneath the rectus femoris around the medial neck.  We cauterized the lateral femoral circumflex vessels and then we made our femoral neck cut with an oscillating saw just proximal to the lesser trochanter and completed this with an osteotome.  I placed a corkscrew guide in the femoral head and removed the femoral head is entirety and found it to be completely devoid of cartilage and the acetabulum completely sclerotic.  We cleaned the acetabulum debris and remnants of acetabular labrum.  I placed a bent Hohmann medially and a Cobra retractor laterally and then began reaming from a size 42 reamer all  the way to a size 56 in 2 mm increments.  All reamers were placed under direct visualization and the last one also in direct fluoroscopy so that we could obtain our depth of reaming, our inclination, and anteversion.  Once we were pleased with this, we placed the real DePuy Sector Gription acetabular component size 56, apex eliminator guide, and the real 36+ 4 neutral polyethylene liner for a size 56 acetabular component.  Attention was then turned to the femur.  With the leg externally rotated to 100 degrees, extended and adducted, we were able to place a Mueller retractor medially and a Hohmann retractor behind the greater trochanter.  We released the lateral joint  capsule and then used a box cutting osteotome to enter the femoral and a rongeur to lateralize.  Then, we began broaching using the Corail broaching system from DePuy from a size 8 broach up to a size 11. With size 11, we trialed a standard head and a 36+ 1 hip ball.  We brought the leg back over and up with traction and internal rotation reducing the pelvis; and I felt radiographically, it looked a little bit short and it was just a little loose.  We dislocated the hip and removed the trial components.  We then placed a real Corail femoral component size 11 and the real 36+ 5 ceramic hip ball, reduced this into the acetabulum.  I was pleased with the range of motion, offset and stability.  I was pleased with the leg lengths under direct fluoroscopy. We then copiously irrigated the wound with normal saline solution.  We closed the joint capsule with interrupted #1 Ethibond suture followed by running #1 Vicryl in the tensor fascia, 0 Vicryl in the deep tissue, 2-0 Vicryl in the subcutaneous tissue, staples on the skin, Xeroform and an Aquacel dressing.  He was taken off the hana table and taken to recovery room in stable condition.  All final counts were correct.  There were no complications noted.  Of note, Richardean CanalGilbert Clark, PA-C assisted during the entire case and his assistance was crucial for facilitating this case.     Vanita Pandahristopher Y. Magnus IvanBlackman, M.D.     CYB/MEDQ  D:  03/21/2014  T:  03/21/2014  Job:  [id number]

## 2014-03-21 NOTE — Evaluation (Signed)
Physical Therapy Evaluation Patient Details Name: Patrick Rivas MRN: [id number] DOB: 1950-12-15 Today's Date: 03/21/2014   History of Present Illness     Clinical Impression  Pt s/p L THR presents with decreased L LE strength/ROM and post op pain limiting functional mobility.  Pt should progress well to d/c home with family assist and HHPT follow up.    Follow Up Recommendations Home health PT    Equipment Recommendations  None recommended by PT    Recommendations for Other Services OT consult     Precautions / Restrictions Precautions Precautions: Fall Restrictions Weight Bearing Restrictions: No Other Position/Activity Restrictions: WBAT      Mobility  Bed Mobility Overal bed mobility: Needs Assistance Bed Mobility: Supine to Sit;Sit to Supine     Supine to sit: Mod assist Sit to supine: Mod assist   General bed mobility comments: cues for sequence and use of R LE to self assist  Transfers Overall transfer level: Needs assistance Equipment used: Rolling walker (2 wheeled) Transfers: Sit to/from Stand Sit to Stand: Mod assist         General transfer comment: cues for LE management and use of UEs to self assist  Ambulation/Gait Ambulation/Gait assistance: Min assist;Mod assist Ambulation Distance (Feet): 89 Feet Assistive device: Rolling walker (2 wheeled) Gait Pattern/deviations: Step-to pattern;Decreased step length - right;Decreased step length - left;Shuffle;Trunk flexed     General Gait Details: cues for sequence, posture and position from [employer]  Stairs            Wheelchair Mobility    Modified Rankin (Stroke Patients Only)       Balance                                             Pertinent Vitals/Pain Pain Assessment: 0-10 Pain Score: 4  Pain Location: L hip and thigh Pain Descriptors / Indicators: Aching;Burning;Sore Pain Intervention(s): Limited activity within patient's tolerance;Monitored during  session;Premedicated before session;Ice applied    Home Living Family/patient expects to be discharged to:: Private residence Living Arrangements: Spouse/significant other Available Help at Discharge: Family Type of Home: House Home Access: Stairs to enter Entrance Stairs-Rails: Right Entrance Stairs-Number of Steps: 3 Home Layout: One level Home Equipment: Environmental consultantWalker - 2 wheels      Prior Function Level of Independence: Independent               Hand Dominance   Dominant Hand: Right    Extremity/Trunk Assessment   Upper Extremity Assessment: Overall WFL for tasks assessed           Lower Extremity Assessment: LLE deficits/detail   LLE Deficits / Details: Hip strength 2+/5 with AAROM at hip to 90 flex and 15 abd  Cervical / Trunk Assessment: Normal  Communication   Communication: No difficulties  Cognition Arousal/Alertness: Awake/alert Behavior During Therapy: WFL for tasks assessed/performed Overall Cognitive Status: Within Functional Limits for tasks assessed                      General Comments      Exercises Total Joint Exercises Ankle Circles/Pumps: AROM;10 reps;Both;Supine Quad Sets: AROM;Both;10 reps;Supine Heel Slides: AAROM;20 reps;Supine;Left Hip ABduction/ADduction: AAROM;Left;10 reps;Supine      Assessment/Plan    PT Assessment Patient needs continued PT services  PT Diagnosis Difficulty walking   PT Problem List Decreased strength;Decreased range of motion;Decreased activity  tolerance;Decreased mobility;Decreased knowledge of use of DME;Pain  PT Treatment Interventions DME instruction;Gait training;Stair training;Functional mobility training;Therapeutic activities;Therapeutic exercise;Patient/family education   PT Goals (Current goals can be found in the Care Plan section) Acute Rehab PT Goals Patient Stated Goal: Ride a bicycle for the first time in a long time PT Goal Formulation: With patient Time For Goal Achievement:  03/28/14 Potential to Achieve Goals: Good    Frequency 7X/week   Barriers to discharge        Co-evaluation               End of Session Equipment Utilized During Treatment: Gait belt Activity Tolerance: Patient tolerated treatment well;Patient limited by fatigue;Patient limited by lethargy Patient left: in bed;with call bell/phone within reach;with family/visitor present Nurse Communication: Mobility status         Time: 1191-4782 PT Time Calculation (min): 33 min   Charges:   PT Evaluation $Initial PT Evaluation Tier I: 1 Procedure PT Treatments $Gait Training: 8-22 mins $Therapeutic Exercise: 8-22 mins   PT G Codes:          BRADSHAW,HUNTER 03/21/2014, 4:34 PM

## 2014-03-21 NOTE — Anesthesia Preprocedure Evaluation (Addendum)
Anesthesia Evaluation  Patient identified by MRN, date of birth, ID band Patient awake    Reviewed: Allergy & Precautions, H&P , NPO status , Patient's Chart, lab work & pertinent test results  Airway Mallampati: I TM Distance: >3 FB Neck ROM: Full    Dental  (+) Teeth Intact, Dental Advisory Given   Pulmonary former smoker,  breath sounds clear to auscultation        Cardiovascular Rhythm:Regular Rate:Normal     Neuro/Psych negative neurological ROS  negative psych ROS   GI/Hepatic negative GI ROS, Neg liver ROS,   Endo/Other  negative endocrine ROS  Renal/GU negative Renal ROS  negative genitourinary   Musculoskeletal negative musculoskeletal ROS (+)   Abdominal   Peds  Hematology negative hematology ROS (+)   Anesthesia Other Findings   Reproductive/Obstetrics negative OB ROS                          Anesthesia Physical Anesthesia Plan  ASA: II  Anesthesia Plan: Spinal   Post-op Pain Management:    Induction:   Airway Management Planned: Simple Face Mask  Additional Equipment: None  Intra-op Plan:   Post-operative Plan:   Informed Consent: I have reviewed the patients History and Physical, chart, labs and discussed the procedure including the risks, benefits and alternatives for the proposed anesthesia with the patient or authorized representative who has indicated his/her understanding and acceptance.   Dental advisory given  Plan Discussed with: CRNA and Surgeon  Anesthesia Plan Comments:         Anesthesia Quick Evaluation

## 2014-03-21 NOTE — Anesthesia Procedure Notes (Addendum)
Spinal  Patient location during procedure: OR Start time: 03/21/2014 7:38 AM End time: 03/21/2014 7:41 AM Staffing Anesthesiologist: Kennieth RadFITZGERALD, ROBERT E CRNA/Resident: Lyda KalataJARVELA, Quest Tavenner R Performed by: anesthesiologist and resident/CRNA  Preanesthetic Checklist Completed: patient identified, site marked, surgical consent, pre-op evaluation, timeout performed, IV checked, risks and benefits discussed and monitors and equipment checked Spinal Block Patient position: sitting Prep: Betadine Patient monitoring: heart rate, cardiac monitor, continuous pulse ox and blood pressure Approach: midline Location: L2-3 Injection technique: single-shot Needle Needle type: Spinocan  Needle gauge: 22 G Needle length: 9 cm Needle insertion depth: 7.5 cm Assessment Sensory level: T4 Additional Notes Performed by Lyda KalataJoshua Lynette Noah CRNA with  Dr. Sampson GoonFitzgerald present.

## 2014-03-21 NOTE — H&P (Signed)
TOTAL HIP ADMISSION H&P  Patient is admitted for left total hip arthroplasty.  Subjective:  Chief Complaint: left hip pain  HPI: Patrick Rivas, 63 y.o. male, has a history of pain and functional disability in the left hip(s) due to arthritis and patient has failed non-surgical conservative treatments for greater than 12 [place] to include NSAID's and/or analgesics, corticosteriod injections, weight reduction as appropriate and activity modification.  Onset of symptoms was gradual starting 3 years ago with gradually worsening course since that time.The patient noted no past surgery on the left hip(s).  Patient currently rates pain in the left hip at 10 out of 10 with activity. Patient has night pain, worsening of pain with activity and weight bearing, trendelenberg gait, pain that interfers with activities of daily living and pain with passive range of motion. Patient has evidence of subchondral cysts, subchondral sclerosis, periarticular osteophytes and joint space narrowing by imaging studies. This condition presents safety issues increasing the risk of falls.  There is no current active infection.  Patient Active Problem List   Diagnosis Date Noted  . Arthritis of left hip 03/21/2014  . Degenerative arthritis of hip 10/05/2012   Past Medical History  Diagnosis Date  . Hepatitis     50  yrs ago" mild"-no problems now  . Heart murmur     doesn't cause any problems  . Arthritis     Osteoarthritis-LEFT hip, back(degenerative disc)    Past Surgical History  Procedure Laterality Date  . Irrigation and debridement sebaceous cyst      x2- on back -none recent  . Tonsillectomy    . Cataract extraction, bilateral    . Total hip arthroplasty Right 10/05/2012    Procedure: Right TOTAL HIP ARTHROPLASTY ANTERIOR APPROACH;  Surgeon: Kathryne Hitchhristopher Y Brigett Estell, MD;  Location: [facility];  Service: Orthopedics;  Laterality: Right;    Prescriptions prior to admission  Medication Sig Dispense Refill  .  acidophilus (RISAQUAD) CAPS capsule Take 1 capsule by mouth daily.      Marland Kitchen. atorvastatin (LIPITOR) 10 MG tablet Take 10 mg by mouth daily. evening      . B Complex-C (B-COMPLEX WITH VITAMIN C) tablet Take 1 tablet by mouth daily.      . cholecalciferol (VITAMIN D) 1000 UNITS tablet Take 1,000 Units by mouth daily.      . diclofenac (VOLTAREN) 75 MG EC tablet Take 75 mg by mouth 2 (two) times daily.      Marland Kitchen. glucosamine-chondroitin 500-400 MG tablet Take 3 tablets by mouth 2 (two) times daily.       . Melatonin 3 MG CAPS Take 3 mg by mouth at bedtime as needed (sleep).      . Omega 3 1200 MG CAPS Take 1,200 mg by mouth 2 (two) times daily.      . metroNIDAZOLE (METROGEL) 0.75 % gel Apply 1 application topically 2 (two) times daily as needed.       . naproxen sodium (ANAPROX) 220 MG tablet Take 220 mg by mouth 2 (two) times daily as needed (for pain).       . traMADol (ULTRAM) 50 MG tablet Take 50 mg by mouth every 6 (six) hours as needed for moderate pain.       No Known Allergies  History  Substance Use Topics  . Smoking status: Former Games developermoker  . Smokeless tobacco: Never Used  . Alcohol Use: Yes     Comment: beer -occ   TEENAGE SMOKING    History reviewed. No pertinent  family history.   Review of Systems  Musculoskeletal: Positive for joint pain.  All other systems reviewed and are negative.   Objective:  Physical Exam  Constitutional: He is oriented to person, place, and time. He appears well-developed and well-nourished.  HENT:  Head: Normocephalic and atraumatic.  Eyes: EOM are normal. Pupils are equal, round, and reactive to light.  Neck: Normal range of motion. Neck supple.  Cardiovascular: Normal rate and regular rhythm.   Respiratory: Effort normal and breath sounds normal.  GI: Soft. Bowel sounds are normal.  Musculoskeletal:       Left hip: He exhibits decreased range of motion, decreased strength and bony tenderness.  Neurological: He is alert and oriented to person,  place, and time.  Skin: Skin is warm and dry.  Psychiatric: He has a normal mood and affect.    Vital signs in last 24 hours: Temp:  [98.5 F (36.9 C)] 98.5 F (36.9 C) (08/21 0544) Pulse Rate:  [81] 81 (08/21 0544) Resp:  [18] 18 (08/21 0544) BP: (130)/(88) 130/88 mmHg (08/21 0544) SpO2:  [98 %] 98 % (08/21 0544) Weight:  [100.245 kg (221 lb)] 100.245 kg (221 lb) (08/21 0544)  Labs:   Estimated body mass index is 29.16 kg/(m^2) as calculated from the following:   Height as of this encounter: 6\' 1"  (1.854 m).   Weight as of this encounter: 100.245 kg (221 lb).   Imaging Review Plain radiographs demonstrate severe degenerative joint disease of the left hip(s). The bone quality appears to be good for age and reported activity level.  Assessment/Plan:  End stage arthritis, left hip(s)  The patient history, physical examination, clinical judgement of the provider and imaging studies are consistent with end stage degenerative joint disease of the left hip(s) and total hip arthroplasty is deemed medically necessary. The treatment options including medical management, injection therapy, arthroscopy and arthroplasty were discussed at length. The risks and benefits of total hip arthroplasty were presented and reviewed. The risks due to aseptic loosening, infection, stiffness, dislocation/subluxation,  thromboembolic complications and other imponderables were discussed.  The patient acknowledged the explanation, agreed to proceed with the plan and consent was signed. Patient is being admitted for inpatient treatment for surgery, pain control, PT, OT, prophylactic antibiotics, VTE prophylaxis, progressive ambulation and ADL's and discharge planning.The patient is planning to be discharged home with home health services

## 2014-03-21 NOTE — Transfer of Care (Signed)
Immediate Anesthesia Transfer of Care Note  Patient: Patrick Rivas  Procedure(s) Performed: Procedure(s) (LRB): LEFT TOTAL HIP ARTHROPLASTY ANTERIOR APPROACH (Left)  Patient Location: PACU  Anesthesia Type: Spinal  Level of Consciousness: sedated, patient cooperative and responds to stimulation  Airway & Oxygen Therapy: Patient Spontanous Breathing and Patient connected to face mask oxgen  Post-op Assessment: Report given to PACU RN and Post -op Vital signs reviewed and stable  Post vital signs: Reviewed and stable  Complications: No apparent anesthesia complications

## 2014-03-22 LAB — BASIC METABOLIC PANEL
ANION GAP: 7 (ref 5–15)
BUN: 11 mg/dL (ref 6–23)
CO2: 27 mEq/L (ref 19–32)
Calcium: 8.7 mg/dL (ref 8.4–10.5)
Chloride: 107 mEq/L (ref 96–112)
Creatinine, Ser: 0.79 mg/dL (ref 0.50–1.35)
GFR calc non Af Amer: >90 mL/min (ref >90)
Glucose, Bld: 113 mg/dL — ABNORMAL HIGH (ref 70–99)
POTASSIUM: 4.2 meq/L (ref 3.7–5.3)
Sodium: 141 mEq/L (ref 137–147)

## 2014-03-22 LAB — CBC
HEMATOCRIT: 34.3 % — AB (ref 39.0–52.0)
Hemoglobin: 11.4 g/dL — ABNORMAL LOW (ref 13.0–17.0)
MCH: 32.9 pg (ref 26.0–34.0)
MCHC: 33.2 g/dL (ref 30.0–36.0)
MCV: 99.1 fL (ref 78.0–100.0)
PLATELETS: 162 10*3/uL (ref 150–400)
RBC: 3.46 MIL/uL — ABNORMAL LOW (ref 4.22–5.81)
RDW: 12.5 % (ref 11.5–15.5)
WBC: 5.1 10*3/uL (ref 4.0–10.5)

## 2014-03-22 MED ORDER — OXYCODONE-ACETAMINOPHEN 5-325 MG PO TABS: 1.0000 | ORAL_TABLET | ORAL | Status: DC | PRN

## 2014-03-22 MED ORDER — ASPIRIN 325 MG PO TBEC: 325.0000 mg | DELAYED_RELEASE_TABLET | Freq: Two times a day (BID) | ORAL | Status: DC

## 2014-03-22 MED ORDER — METHOCARBAMOL 500 MG PO TABS: 500.0000 mg | ORAL_TABLET | Freq: Four times a day (QID) | ORAL | Status: DC | PRN

## 2014-03-22 NOTE — Progress Notes (Signed)
Physical Therapy Treatment Patient Details Name: Patrick Rivas MRN: [id number] DOB: 10/30/1950 Today's Date: 03/22/2014    History of Present Illness      PT Comments    Motivated and progressing well  Follow Up Recommendations  Home health PT     Equipment Recommendations  None recommended by PT    Recommendations for Other Services OT consult     Precautions / Restrictions Precautions Precautions: Fall Restrictions Weight Bearing Restrictions: No Other Position/Activity Restrictions: WBAT    Mobility  Bed Mobility Overal bed mobility: Needs Assistance Bed Mobility: Supine to Sit     Supine to sit: Min assist     General bed mobility comments: cues for sequence and use of R LE to self assist  Transfers Overall transfer level: Needs assistance Equipment used: Rolling walker (2 wheeled) Transfers: Sit to/from Stand Sit to Stand: Min assist         General transfer comment: cues for LE management and use of UEs to self assist  Ambulation/Gait Ambulation/Gait assistance: Min assist;Min guard Ambulation Distance (Feet): 210 Feet Assistive device: Rolling walker (2 wheeled) Gait Pattern/deviations: Step-to pattern;Step-through pattern;Decreased step length - left;Decreased step length - right;Shuffle;Trunk flexed     General Gait Details: cues for sequence, posture and position from [facility] [employer]   Stairs            Wheelchair Mobility    Modified Rankin (Stroke Patients Only)       Balance                                    Cognition Arousal/Alertness: Awake/alert Behavior During Therapy: WFL for tasks assessed/performed Overall Cognitive Status: Within Functional Limits for tasks assessed                      Exercises Total Joint Exercises Ankle Circles/Pumps: AROM;10 reps;Both;Supine Quad Sets: AROM;Both;10 reps;Supine Heel Slides: AAROM;20 reps;Supine;Left Hip ABduction/ADduction: AAROM;Left;Supine;15 reps     General Comments        Pertinent Vitals/Pain Pain Assessment: 0-10 Pain Score: 3  Pain Location: L hip Pain Descriptors / Indicators: Aching;Burning Pain Intervention(s): Limited activity within patient's tolerance;Monitored during session;Premedicated before session;Ice applied    Home Living                      Prior Function            PT Goals (current goals can now be found in the care plan section) Acute Rehab PT Goals Patient Stated Goal: Ride a bicycle for the first time in a long time PT Goal Formulation: With patient Time For Goal Achievement: 03/28/14 Potential to Achieve Goals: Good Progress towards PT goals: Progressing toward goals    Frequency  7X/week    PT Plan Current plan remains appropriate    Co-evaluation             End of Session Equipment Utilized During Treatment: Gait belt Activity Tolerance: Patient tolerated treatment well Patient left: in chair;with call bell/phone within reach;with family/visitor present     Time: [id number] PT Time Calculation (min): 38 min  Charges:  $Gait Training: 23-37 mins $Therapeutic Exercise: 8-22 mins                    G Codes:      Patrick Rivas 03/22/2014, 9:59 AM

## 2014-03-22 NOTE — Progress Notes (Signed)
CARE MANAGEMENT NOTE 03/22/2014  Patient:  Patrick Rivas,Patrick Rivas   Account Number:  [id number]  Date Initiated:  03/22/2014  Documentation [facility]:  [facility]  Subjective/Objective Assessment:   Left total hip arthroplasty     Action/Plan:   Anticipated DC Date:  03/23/2014   Anticipated DC Plan:  HOME W HOME HEALTH SERVICES      DC Planning Services  CM consult      Choice offered to / List presented [facility]:          [facility]      [facility]   Status of service:  Completed, signed off Medicare Important Message given?   (If response is "NO", the following Medicare IM given date fields will be blank) Date Medicare IM given:   Medicare IM given by:   Date Additional Medicare IM given:   Additional Medicare IM given by:    Discharge Disposition:  HOME W HOME HEALTH SERVICES  Per UR Regulation:    If discussed at Long Length of Stay Meetings, dates discussed:    Comments:  03/22/2014 1300 NCM spoke to pt and [facility] Pt states he has RW and 3n1 at home from previous surgery. Isidoro DonningAlesia Beauregard Jarrells RN CCM Case Mgmt phone [phone]

## 2014-03-22 NOTE — Discharge Instructions (Signed)
Increase your activities as comfort allows. You can get your current dressing wet daily in the shower. Leave your current dressing on for the next 7 days; then you can remove it and get the incision wet, leaving the steri-strips in place.

## 2014-03-22 NOTE — Progress Notes (Signed)
Physical Therapy Treatment Patient Details Name: Patrick Rivas MRN: [id number] DOB: [birth year] Today's Date: 03/22/2014    History of Present Illness LTHA    PT Comments    Progressing well and looking forward to d/c in am  Follow Up Recommendations  Home health PT     Equipment Recommendations  None recommended by PT    Recommendations for Other Services OT consult     Precautions / Restrictions Precautions Precautions: Fall Restrictions Weight Bearing Restrictions: No Other Position/Activity Restrictions: WBAT    Mobility  Bed Mobility Overal bed mobility: Needs Assistance Bed Mobility: Supine to Sit     Supine to sit: Min assist Sit to supine: Min guard   General bed mobility comments: cues for sequence and use of R LE to self assist  Transfers Overall transfer level: Needs assistance Equipment used: Rolling walker (2 wheeled) Transfers: Sit to/from Stand Sit to Stand: Min assist         General transfer comment: cues for LE management and use of UEs to self assist  Ambulation/Gait Ambulation/Gait assistance: Min guard Ambulation Distance (Feet): 300 Feet Assistive device: Rolling walker (2 wheeled) Gait Pattern/deviations: Step-to pattern;Step-through pattern;Decreased step length - right;Decreased step length - left;Shuffle;Trunk flexed     General Gait Details: cues for sequence, posture and position from RW   Stairs Stairs: Yes Stairs assistance: Min assist Stair Management: No rails;Step to pattern;Backwards;With walker Number of Stairs: 4 General stair comments: cues for sequence, saftey and foot/RW placement  Wheelchair Mobility    Modified Rankin (Stroke Patients Only)       Balance                                    Cognition Arousal/Alertness: Awake/alert Behavior During Therapy: WFL for tasks assessed/performed Overall Cognitive Status: Within Functional Limits for tasks assessed                       Exercises      General Comments        Pertinent Vitals/Pain Pain Assessment: 0-10 Pain Score: 2  Pain Location: L hip    Home Living Family/patient expects to be discharged to:: Private residence Living Arrangements: Spouse/significant other Available Help at Discharge: Family Type of Home: House Home Access: Stairs to enter Entrance Stairs-Rails: Right Home Layout: One level Home Equipment: Environmental consultantWalker - 2 wheels      Prior Function Level of Independence: Independent          PT Goals (current goals can now be found in the care plan section) Acute Rehab PT Goals Patient Stated Goal: Ride a bicycle for the first time in a long time PT Goal Formulation: With patient Time For Goal Achievement: 03/28/14 Potential to Achieve Goals: Good Progress towards PT goals: Progressing toward goals    Frequency  7X/week    PT Plan Current plan remains appropriate    Co-evaluation             End of Session Equipment Utilized During Treatment: Gait belt Activity Tolerance: Patient tolerated treatment well Patient left: in bed;with call bell/phone within reach;with family/visitor present     Time: 1350-1415 PT Time Calculation (min): 25 min  Charges:  $Gait Training: 8-22 mins $Therapeutic Activity: 8-22 mins                    G Codes:  Pearlena Ow 03/22/2014, 4:22 PM

## 2014-03-22 NOTE — Progress Notes (Signed)
Subjective: Pt stable - pain ok - reports leg swelling   Objective: Vital signs in last 24 hours: Temp:  [97.5 F (36.4 C)-99.1 F (37.3 C)] 98.3 F (36.8 C) (08/22 0558) Pulse Rate:  [55-83] 72 (08/22 0558) Resp:  [10-18] 18 (08/22 0558) BP: (98-159)/(20-99) 108/60 mmHg (08/22 0558) SpO2:  [93 %-100 %] 100 % (08/22 0558) Weight:  [99.791 kg (220 lb)] 99.791 kg (220 lb) (08/21 1050)  Intake/Output from previous day: 08/21 0701 - 08/22 0700 In: 3773.8 [P.O.:960; I.V.:2763.8; IV Piggyback:50] Out: 3350 [Urine:2950; Blood:400] Intake/Output this shift:    Exam:  Neurovascular intact Sensation intact distally Compartment soft  Labs:  Recent Labs  03/22/14 0440  HGB 11.4*    Recent Labs  03/22/14 0440  WBC 5.1  RBC 3.46*  HCT 34.3*  PLT 162    Recent Labs  03/22/14 0440  NA 141  K 4.2  CL 107  CO2 27  BUN 11  CREATININE 0.79  GLUCOSE 113*  CALCIUM 8.7   No results found for this basename: LABPT, INR,  in the last 72 hours  Assessment/Plan: Plan to mobilize today - re check am   Myran Arcia SCOTT 03/22/2014, 8:09 AM

## 2014-03-22 NOTE — Evaluation (Signed)
Occupational Therapy Evaluation Patient Details Name: Patrick Rivas MRN: [id number] DOB: 1950/09/11 Today's Date: 03/22/2014    History of Present Illness LTHA   Clinical Impression   All education complete regarding ADL activity s/p L THA    Follow Up Recommendations  No OT follow up    Equipment Recommendations  None recommended by OT       Precautions / Restrictions Precautions Precautions: Fall Restrictions Weight Bearing Restrictions: No Other Position/Activity Restrictions: WBAT      Mobility Bed Mobility Overal bed mobility: Needs Assistance Bed Mobility: Supine to Sit     Supine to sit: Min assist     General bed mobility comments: cues for sequence and use of R LE to self assist  Transfers Overall transfer level: Needs assistance Equipment used: Rolling walker (2 wheeled)   Sit to Stand: Min assist         General transfer comment: cues for LE management and use of UEs to self assist         ADL Overall ADL's : Needs assistance/impaired                     Lower Body Dressing: Minimal assistance;Sit to/from stand   Toilet Transfer: Supervision/safety;Ambulation;RW   Toileting- Clothing Manipulation and Hygiene: Supervision/safety;Sit to/from stand         General ADL Comments: Pt overall S- min A with ADL activity. Wife will A as needed.  Educated on pt regarding ADL activity s/p L THA.  Pt does not need any DME. Pt does have a shoe horn and possibly a walker        Perception         Pertinent Vitals/Pain Pain Score: 2  Pain Location: L hip     Hand Dominance Right   Extremity/Trunk Assessment Upper Extremity Assessment Upper Extremity Assessment: Overall WFL for tasks assessed           Communication Communication Communication: No difficulties   Cognition Arousal/Alertness: Awake/alert Behavior During Therapy: WFL for tasks assessed/performed Overall Cognitive Status: Within Functional Limits for tasks  assessed                     General Comments   Pt doing well and has all needed equipment. Wife will A as needed      Home Living Family/patient expects to be discharged to:: Private residence Living Arrangements: Spouse/significant other Available Help at Discharge: Family Type of Home: House Home Access: Stairs to enter Secretary/administratorntrance Stairs-Number of Steps: 3 Entrance Stairs-Rails: Right Home Layout: One level     Bathroom Shower/Tub: Chief Strategy OfficerTub/shower unit   Bathroom Toilet: Standard     Home Equipment: Environmental consultantWalker - 2 wheels          Prior Functioning/Environment Level of Independence: Independent                      OT Goals(Current goals can be found in the care plan section) Acute Rehab OT Goals Patient Stated Goal: Ride a bicycle for the first time in a long time OT Goal Formulation: With patient  OT Frequency:      End of Session Nurse Communication: Mobility status  Activity Tolerance: Patient tolerated treatment well Patient left: in chair;with call bell/phone within reach;with family/visitor present   Time: 8295-62131305-1325 OT Time Calculation (min): 20 min Charges:  OT General Charges $OT Visit: 1 Procedure OT Evaluation $Initial OT Evaluation Tier I: 1 Procedure OT Treatments $Self Care/Home  Management : 8-22 mins G-Codes:    Alba Cory 04-06-2014, 3:59 PM

## 2014-03-23 LAB — CBC
HCT: 30.7 % — ABNORMAL LOW (ref 39.0–52.0)
HEMOGLOBIN: 10.6 g/dL — AB (ref 13.0–17.0)
MCH: 33.7 pg (ref 26.0–34.0)
MCHC: 34.5 g/dL (ref 30.0–36.0)
MCV: 97.5 fL (ref 78.0–100.0)
Platelets: 166 10*3/uL (ref 150–400)
RBC: 3.15 MIL/uL — AB (ref 4.22–5.81)
RDW: 12.4 % (ref 11.5–15.5)
WBC: 6.5 10*3/uL (ref 4.0–10.5)

## 2014-03-23 NOTE — Progress Notes (Signed)
Physical Therapy Treatment Patient Details Name: Patrick Rivas MRN: [id number] DOB: 05/16/1951 Today's Date: 03/23/2014    History of Present Illness LTHA    PT Comments    POD # 2 pt eager to D/C to home today.  Practiced stairs with spouse, performed all THR TE's following HEP handout and amb in hallway.  Follow Up Recommendations  Home health PT     Equipment Recommendations  None recommended by PT    Recommendations for Other Services       Precautions / Restrictions Precautions Precautions: Fall Restrictions Weight Bearing Restrictions: No Other Position/Activity Restrictions: WBAT    Mobility  Bed Mobility Overal bed mobility: Needs Assistance Bed Mobility: Supine to Sit;Sit to Supine     Supine to sit: Supervision Sit to supine: Supervision   General bed mobility comments: demonstarted and instructed pt how to use a bed sheet to asssit L LE off/on bed  Transfers Overall transfer level: Needs assistance Equipment used: Rolling walker (2 wheeled) Transfers: Sit to/from Stand Sit to Stand: Supervision         General transfer comment: <25% VC's on proper tech and hand placement  Ambulation/Gait Ambulation/Gait assistance: Min guard;Supervision Ambulation Distance (Feet): 255 Feet Assistive device: Rolling walker (2 wheeled) Gait Pattern/deviations: Step-to pattern;Trunk flexed Gait velocity: decreased   General Gait Details: 25% cues for sequence, posture and position from RW and 50% VC's to decrease gait spped to increase safety.   Stairs Stairs: Yes Stairs assistance: Min assist Stair Management: With walker;No rails Number of Stairs: 8 (4 steps twice) General stair comments: cues for sequence, saftey and foot/RW placement and spouse present to assist and family education  Wheelchair Mobility    Modified Rankin (Stroke Patients Only)       Balance                                    Cognition                             Exercises   Total Hip Replacement TE's Supine 10 reps ankle pumps 10 reps knee presses 10 reps heel slides 10 reps SAQ's 10 reps ABD Sitting 10 reps LAQ's Standing 10 reps hip flex 10 reps hip ABD 10 reps hip extension 10 reps knee flex Followed by ICE     General Comments  Pt has met goals to D/c to home      Pertinent Vitals/Pain      Home Living                      Prior Function            PT Goals (current goals can now be found in the care plan section) Progress towards PT goals: Progressing toward goals    Frequency  7X/week    PT Plan      Co-evaluation             End of Session Equipment Utilized During Treatment: Gait belt Activity Tolerance: Patient tolerated treatment well Patient left: in bed;with call bell/phone within reach;with family/visitor present     Time: 0355-9741 PT Time Calculation (min): 35 min  Charges:  $Gait Training: 8-22 mins $Therapeutic Exercise: 8-22 mins                    G Codes:  Rica Koyanagi  PTA [facility]  Acute  Rehab Pager      [phone]

## 2014-03-23 NOTE — Progress Notes (Signed)
Patient has d/c order to home.  Reviewed discharge paperwork with patient and wife, both verbalized understanding.  Prescriptions given to wife.  Patient escorted from floor via wheelchair with NT.  Dorothyann Peng RN

## 2014-03-23 NOTE — Discharge Summary (Signed)
Patient ID: Patrick Rivas MRN: [id number] DOB/AGE: 1951/02/08 63 y.o.  Admit date: 03/21/2014 Discharge date: 03/23/2014  Admission Diagnoses:  Principal Problem:   Arthritis of left hip Active Problems:   Status post total replacement of left hip   Discharge Diagnoses:  Same  Past Medical History  Diagnosis Date  . Hepatitis     50  yrs ago" mild"-no problems now  . Heart murmur     doesn't cause any problems  . Arthritis     Osteoarthritis-LEFT hip, back(degenerative disc)    Surgeries: Procedure(s): LEFT TOTAL HIP ARTHROPLASTY ANTERIOR APPROACH on 03/21/2014   Consultants:    Discharged Condition: Improved  Hospital Course: Patrick Rivas is an 63 y.o. male who was admitted 03/21/2014 for operative treatment ofArthritis of left hip. Patient has severe unremitting pain that affects sleep, daily activities, and work/hobbies. After pre-op clearance the patient was taken to the operating room on 03/21/2014 and underwent  Procedure(s): LEFT TOTAL HIP ARTHROPLASTY ANTERIOR APPROACH.    Patient was given perioperative antibiotics: Anti-infectives   Start     Dose/Rate Route Frequency Ordered Stop   03/21/14 1400  ceFAZolin (ANCEF) IVPB 2 g/50 mL premix     2 g 100 mL/hr over 30 Minutes Intravenous Every 6 hours 03/21/14 1105 03/21/14 2136   03/21/14 0543  ceFAZolin (ANCEF) IVPB 2 g/50 mL premix     2 g 100 mL/hr over 30 Minutes Intravenous On call to O.R. 03/21/14 0543 03/21/14 0745       Patient was given sequential compression devices, early ambulation, and chemoprophylaxis to prevent DVT.  Patient benefited maximally from hospital stay and there were no complications.    Recent vital signs: Patient Vitals for the past 24 hrs:  BP Temp Temp src Pulse Resp SpO2  03/23/14 0639 - 99.2 F (37.3 C) Oral - - -  03/23/14 0618 100/57 mmHg 100.1 F (37.8 C) Oral 98 18 98 %  03/23/14 0328 - - - - 18 98 %  03/22/14 2352 - - - - 18 98 %  03/22/14 2209 120/63 mmHg 100 F  (37.8 C) Oral 92 18 98 %  03/22/14 2000 - - - - 18 95 %     Recent laboratory studies:  Recent Labs  03/22/14 0440 03/23/14 0436  WBC 5.1 6.5  HGB 11.4* 10.6*  HCT 34.3* 30.7*  PLT 162 166  NA 141  --   K 4.2  --   CL 107  --   CO2 27  --   BUN 11  --   CREATININE 0.79  --   GLUCOSE 113*  --   CALCIUM 8.7  --      Discharge Medications:     Medication List    STOP taking these medications       diclofenac 75 MG EC tablet  Commonly known as:  VOLTAREN     traMADol 50 MG tablet  Commonly known as:  ULTRAM      TAKE these medications       acidophilus Caps capsule  Take 1 capsule by mouth daily.     aspirin 325 MG EC tablet  Take 1 tablet (325 mg total) by mouth 2 (two) times daily after a meal.     atorvastatin 10 MG tablet  Commonly known as:  LIPITOR  Take 10 mg by mouth daily. evening     B-complex with vitamin C tablet  Take 1 tablet by mouth daily.     cholecalciferol 1000 UNITS  tablet  Commonly known as:  VITAMIN D  Take 1,000 Units by mouth daily.     glucosamine-chondroitin 500-400 MG tablet  Take 3 tablets by mouth 2 (two) times daily.     Melatonin 3 MG Caps  Take 3 mg by mouth at bedtime as needed (sleep).     methocarbamol 500 MG tablet  Commonly known as:  ROBAXIN  Take 1 tablet (500 mg total) by mouth every 6 (six) hours as needed for muscle spasms.     metroNIDAZOLE 0.75 % gel  Commonly known as:  METROGEL  Apply 1 application topically 2 (two) times daily as needed.     naproxen sodium 220 MG tablet  Commonly known as:  ANAPROX  Take 220 mg by mouth 2 (two) times daily as needed (for pain).     Omega 3 1200 MG Caps  Take 1,200 mg by mouth 2 (two) times daily.     oxyCODONE-acetaminophen 5-325 MG per tablet  Commonly known as:  ROXICET  Take 1-2 tablets by mouth every 4 (four) hours as needed.        Diagnostic Studies: Dg Hip Complete Left  03/21/2014   CLINICAL DATA:  Post left anterior hip replacement  EXAM: LEFT  HIP - COMPLETE 2+ VIEW  COMPARISON:  None.  FLUOROSCOPY TIME:  38 seconds  FINDINGS: Two spot intraoperative fluoroscopic images of the left hip are provided for review.  Images demonstrate the sequela of left total hip replacement. Alignment appears anatomic on the solitary AP projection. No definite fracture. There is expected subcutaneous emphysema about the operative site.  Post right total hip replacement, incompletely evaluated. Several phleboliths overlie the left hemipelvis. No radiopaque foreign body.  IMPRESSION: Post left total hip replacement without evidence of complication.   Electronically Signed   By: Simonne Come M.D.   On: 03/21/2014 09:39   Dg Pelvis Portable  03/21/2014   CLINICAL DATA:  Left hip arthroplasty  EXAM: PORTABLE PELVIS 1-2 VIEWS  COMPARISON:  None.  FINDINGS: Left-sided arthroplasty. The acetabular component and femoral femoral head component appear well seated. Right hip arthroplasty noted.  IMPRESSION: No complication following left hip arthroplasty.   Electronically Signed   By: Genevive Bi M.D.   On: 03/21/2014 10:20   Dg Hip Portable 1 View Left  03/21/2014   CLINICAL DATA:  Left hip arthroplasty  EXAM: PORTABLE LEFT HIP - 1 VIEW  COMPARISON:  None.  FINDINGS: Lateral projection demonstrates total left arthroplasty. No fracture dislocation.  IMPRESSION: No complication on lateral view of left hip arthroplasty.   Electronically Signed   By: Genevive Bi M.D.   On: 03/21/2014 10:18   Dg C-arm 61-120 Min-no Report  03/21/2014   CLINICAL DATA: intraop   C-ARM 61-120 MINUTES  Fluoroscopy was utilized by the requesting physician.  No radiographic  interpretation.     Disposition: 01-Home or Self Care        Follow-up Information   Follow up with Kathryne Hitch, MD In 2 Anwar.   Specialty:  Orthopedic Surgery   Contact information:   [address] [place] [address] [phone]       Follow up with [facility]  Physical Therapy)    Contact information:   [address] [place] [address] [phone]        Signed: Kathryne Hitch 03/23/2014, 7:38 PM

## 2014-03-24 ENCOUNTER — Encounter (HOSPITAL_COMMUNITY): Payer: Self-pay | Admitting: Orthopaedic Surgery

## 2015-01-05 ENCOUNTER — Ambulatory Visit: Payer: Self-pay | Admitting: Surgery

## 2015-01-05 NOTE — H&P (Signed)
Patrick Rivas 01/05/2015 9:27 AM Location: [facility] Patient #: [id number] DOB: [birth year] Married / Language: Patrick Rivas / Race: White Male History of Present Illness Patrick Fus A. Sisto Granillo MD; 01/05/2015 10:50 AM) Patient words: RIH             Pt sent at the request of Patrick Rivas for right groin bulge and mild discomfort for one month. Pops in and out especially when standing.  The patient is a 64 year old male who presents with an inguinal hernia. The hernia(s) is/are located on the right side. Symptoms include inguinal bulge. The pain is located in the right inguinal area. There is no radiation. The patient describes the pain as dull. Onset was gradual 4 week(s) ago. Other Problems Patrick Rivas, CMA; 01/05/2015 9:28 AM) Arthritis Back Pain Heart murmur Hypercholesterolemia  Past Surgical History Patrick Rivas, CMA; 01/05/2015 9:28 AM) Cataract Surgery Bilateral. Hip Surgery Bilateral. Tonsillectomy  Diagnostic Studies History Patrick Rivas, CMA; 01/05/2015 9:28 AM) Colonoscopy 1-5 years ago  Allergies Patrick Rivas, CMA; 01/05/2015 9:28 AM) No Known Drug Allergies 01/05/2015  Medication History Patrick Rivas, CMA; 01/05/2015 9:31 AM) Atorvastatin Calcium (  Tablet, Oral daily) Active. Diclofenac Sodium (  Tablet Patrick, Oral two times daily) Active. Vitamin D (1000UNIT Capsule, Oral) Active. Vitamin B Complex (Oral daily) Active. Fish Oil (  Capsule, Oral two times daily) Active. Joint Health (750-375-30MG  Tablet, Oral daily) Active. Probiotic-Prebiotic (1-250BILLION-MG Capsule, Oral) Active. Medications Reconciled  Social History Patrick Rivas, [place]; 01/05/2015 9:28 AM) Alcohol use Occasional alcohol use. Caffeine use Coffee. No drug use Tobacco use Never smoker.  Family History Patrick Rivas, [place]; 01/05/2015 9:28 AM) Cancer Mother. Cerebrovascular Accident Father. Colon Polyps Father. Hypertension Father. Melanoma Father. Prostate  Cancer Father.     Review of Systems Patrick Rivas CMA; 01/05/2015 9:28 AM) General Not Present- Appetite Loss, Chills, Fatigue, Fever, Night Sweats, Weight Gain and Weight Loss. Skin Not Present- Change in Wart/Mole, Dryness, Hives, Jaundice, New Lesions, Non-Healing Wounds, Rash and Ulcer. HEENT Not Present- Earache, Hearing Loss, Hoarseness, Nose Bleed, Oral Ulcers, Ringing in the Ears, Seasonal Allergies, Sinus Pain, Sore Throat, Visual Disturbances, Wears glasses/contact lenses and Yellow Eyes. Respiratory Not Present- Bloody sputum, Chronic Cough, Difficulty Breathing, Snoring and Wheezing. Breast Not Present- Breast Mass, Breast Pain, Nipple Discharge and Skin Changes. Cardiovascular Not Present- Chest Pain, Difficulty Breathing Lying Down, Leg Cramps, Palpitations, Rapid Heart Rate, Shortness of Breath and Swelling of Extremities. Gastrointestinal Not Present- Abdominal Pain, Bloating, Bloody Stool, Change in Bowel Habits, Chronic diarrhea, Constipation, Difficulty Swallowing, Excessive gas, Gets full quickly at meals, Hemorrhoids, Indigestion, Nausea, Rectal Pain and Vomiting. Male Genitourinary Not Present- Blood in Urine, Change in Urinary Stream, Frequency, Impotence, Nocturia, Painful Urination, Urgency and Urine Leakage. Musculoskeletal Not Present- Back Pain, Joint Pain, Joint Stiffness, Muscle Pain, Muscle Weakness and Swelling of Extremities. Neurological Not Present- Decreased Memory, Fainting, Headaches, Numbness, Seizures, Tingling, Tremor, Trouble walking and Weakness. Psychiatric Not Present- Anxiety, Bipolar, Change in Sleep Pattern, Depression, Fearful and Frequent crying. Endocrine Not Present- Cold Intolerance, Excessive Hunger, Hair Changes, Heat Intolerance, Hot flashes and New Diabetes. Hematology Not Present- Easy Bruising, Excessive bleeding, Gland problems, HIV and Persistent Infections.  Vitals Patrick Rivas CMA; 01/05/2015 9:31 AM) 01/05/2015 9:31 AM Weight: 226 lb  Height: 72in Body Surface Area: 2.28 m Body Mass Index: 30.65 kg/m Temp.: 98.77F(Oral)  Pulse: 68 (Regular)  Resp.: 18 (Unlabored)  BP: 138/70 (Sitting, Left Arm, Standard)     Physical Exam (Patrick Breaker A. Santiago Stenzel MD; 01/05/2015 10:51 AM)  General Mental Status-Alert.  General Appearance-Consistent with stated age. Hydration-Well hydrated. Voice-Normal.  Head and Neck Head-normocephalic, atraumatic with no lesions or palpable masses. Trachea-midline.  Eye Eyeball - Bilateral-Extraocular movements intact. Sclera/Conjunctiva - Bilateral-No scleral icterus.  Chest and Lung Exam Chest and lung exam reveals -quiet, even and easy respiratory effort with no use of accessory muscles and on auscultation, normal breath sounds, no adventitious sounds and normal vocal resonance. Inspection Chest Wall - Normal. Back - normal.  Cardiovascular Cardiovascular examination reveals -normal heart sounds, regular rate and rhythm with no murmurs and normal pedal pulses bilaterally.  Abdomen Inspection Skin - Scar - no surgical scars. Hernias - Inguinal hernia - Right - Reducible. Palpation/Percussion Palpation and Percussion of the abdomen reveal - Soft, Non Tender, No Rebound tenderness, No Rigidity (guarding) and No hepatosplenomegaly. Auscultation Auscultation of the abdomen reveals - Bowel sounds normal.  Neurologic Neurologic evaluation reveals -alert and oriented x 3 with no impairment of recent or remote memory. Mental Status-Normal.  Musculoskeletal Normal Exam - Left-Upper Extremity Strength Normal and Lower Extremity Strength Normal. Normal Exam - Right-Upper Extremity Strength Normal, Lower Extremity Weakness.    Assessment & Plan (Patrick Blyth A. Nardos Putnam MD; 01/05/2015 10:34 AM)  RIGHT INGUINAL HERNIA (550.90  K40.90) Impression: DISCUSSED REPAIR WITH MESH OPEN AND LAPROSCOPIC. HE DESIRES OPEN REPAIR ON THE RIGHT. The risk of hernia repair  include bleeding, infection, organ injury, bowel injury, bladder injury, nerve injury recurrent hernia, blood clots, worsening of underlying condition, chronic pain, mesh use, open surgery, death, and the need for other operattions. Pt agrees to proceed  Current Plans Pt Education - Patient information: Inguinal and femoral (groin) hernias (The Basics): discussed with patient and provided information. Pt Education - Hernia: discussed with patient and provided information. Pt Education - CCS Umbilical/ Inguinal Hernia HCI

## 2015-05-18 IMAGING — US US CAROTID DUPLEX BILAT
1 series · 13 of 24 positions shown · non-contrast
Comparison: None.

CLINICAL DATA: Carotid bruit

EXAM:
BILATERAL CAROTID DUPLEX ULTRASOUND
TECHNIQUE: Gray scale imaging, color Doppler and duplex ultrasound were
performed of bilateral carotid and vertebral arteries in the neck.

[Series 1: us carotid duplex bilat · 0.08mm/px · 13 of 54 slices shown]
[im 1/54]
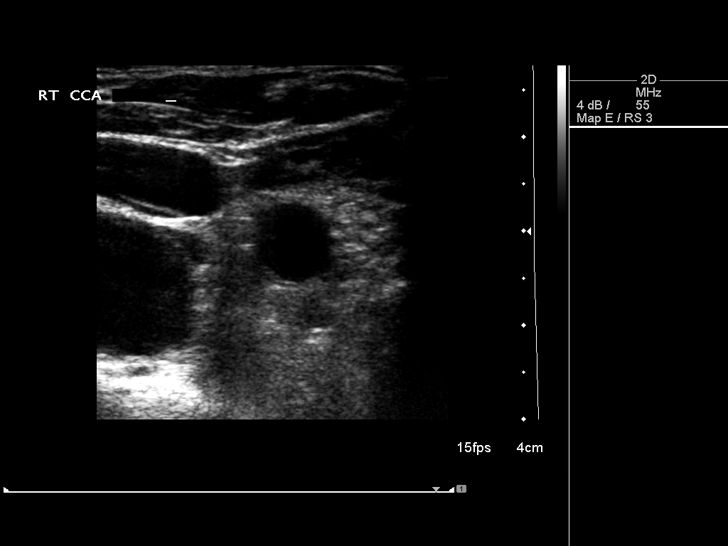
[im 5/54]
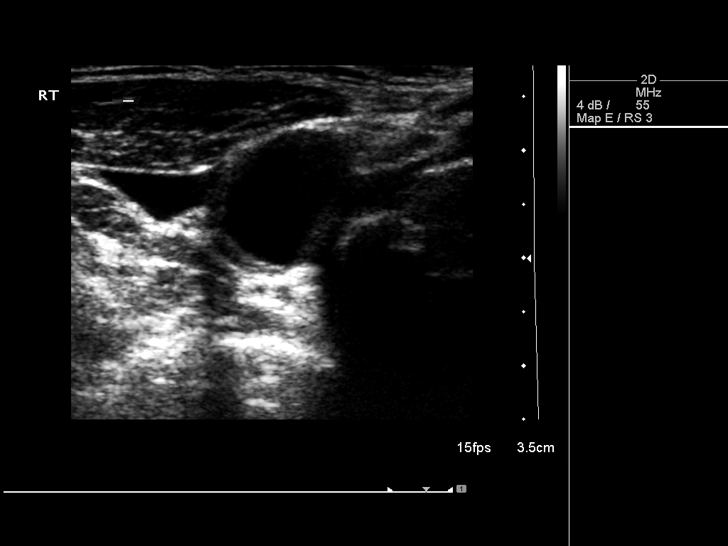
[im 10/54]
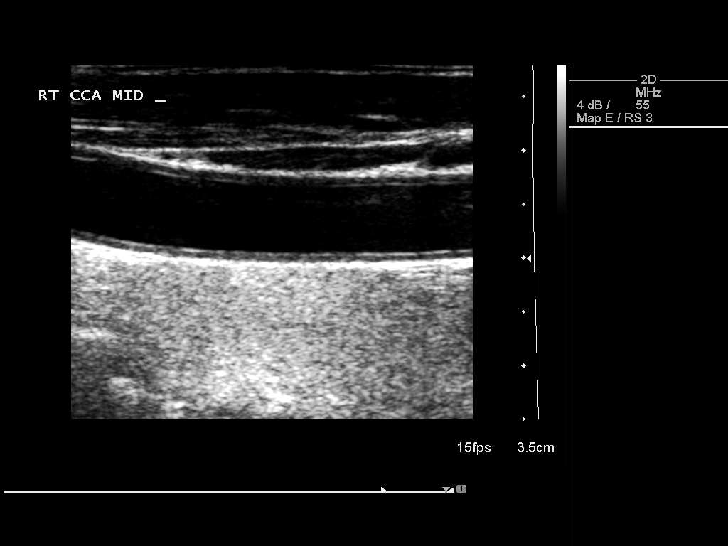
[im 14/54]
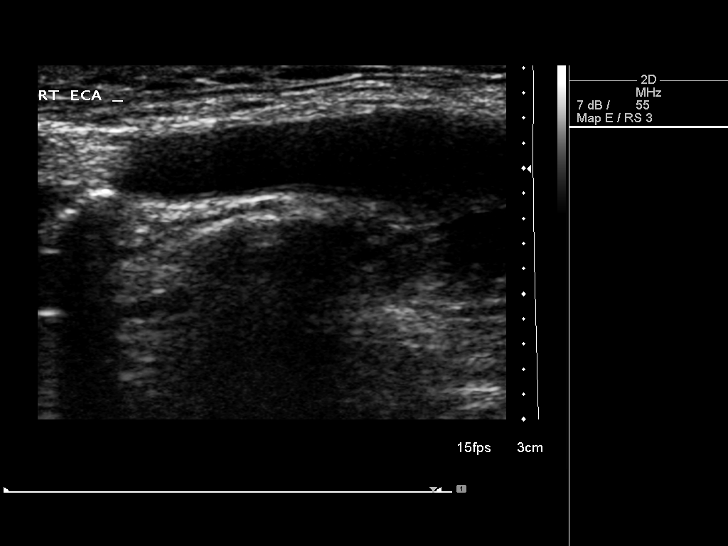
[im 19/54]
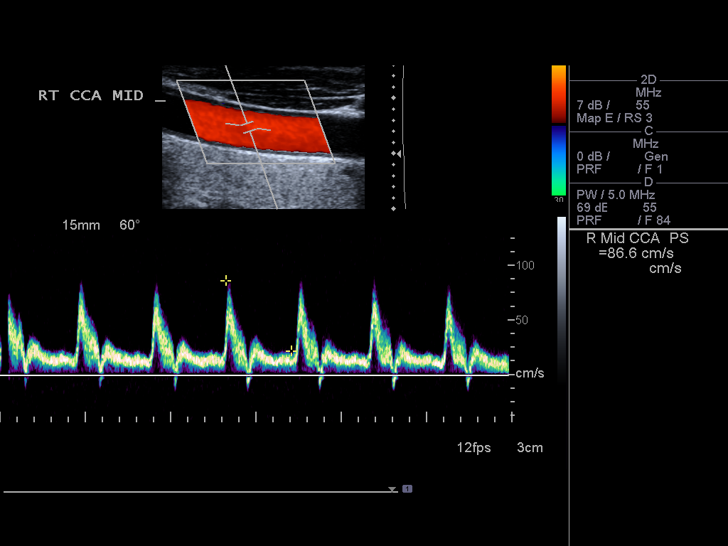
[im 24/54]
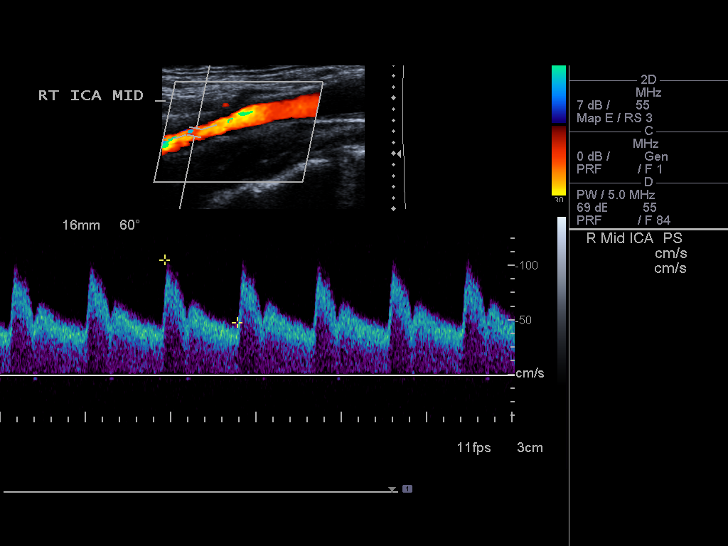
[im 28/54]
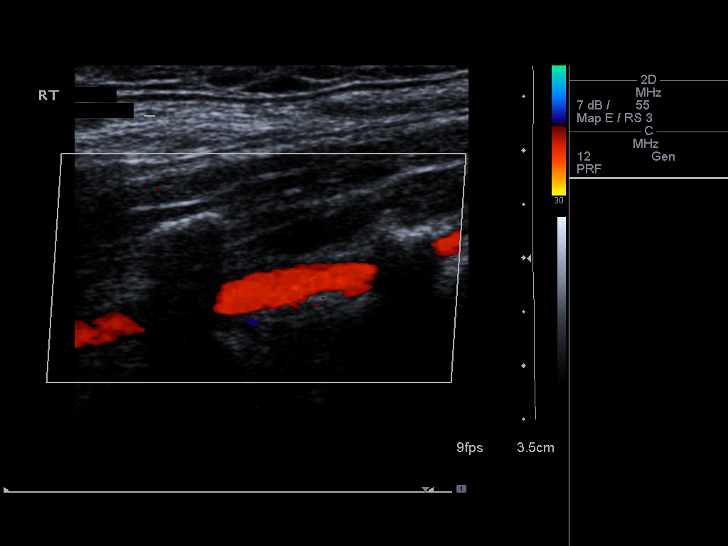
[im 30/54]
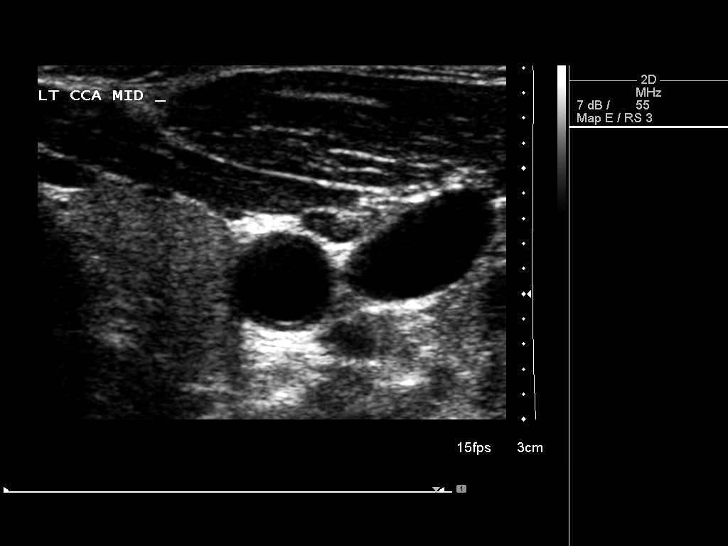
[im 35/54]
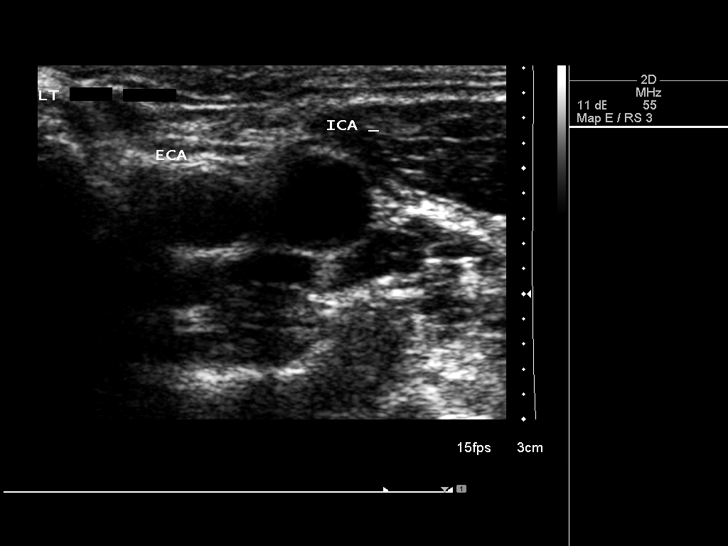
[im 40/54]
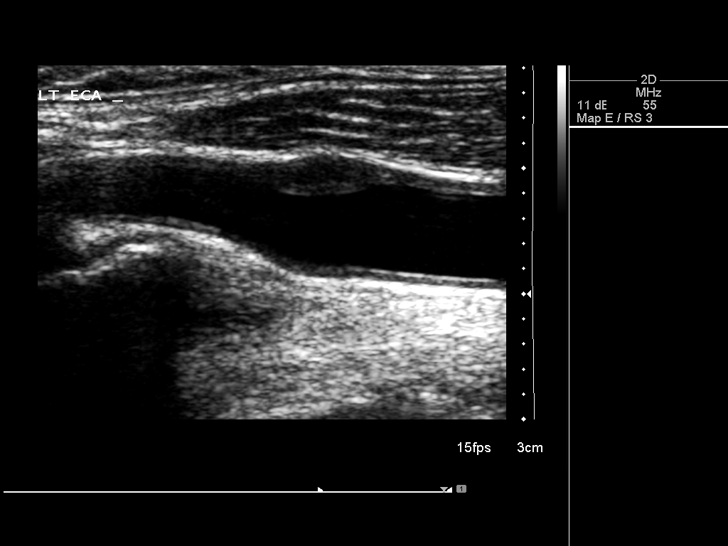
[im 44/54]
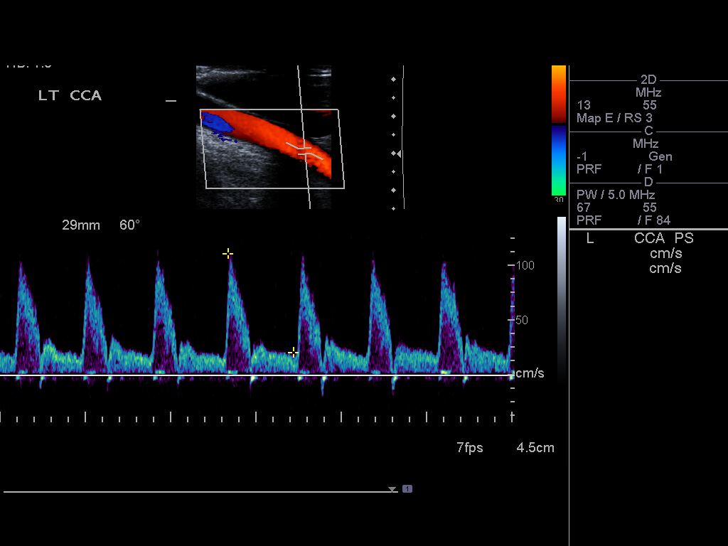
[im 49/54]
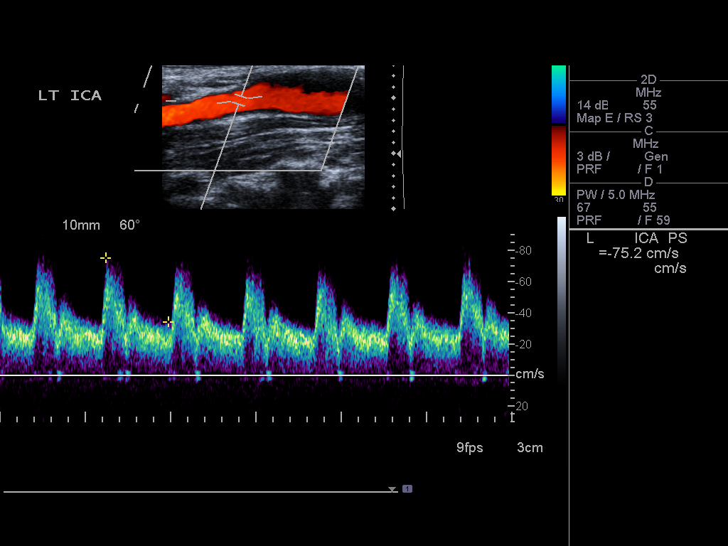
[im 54/54]
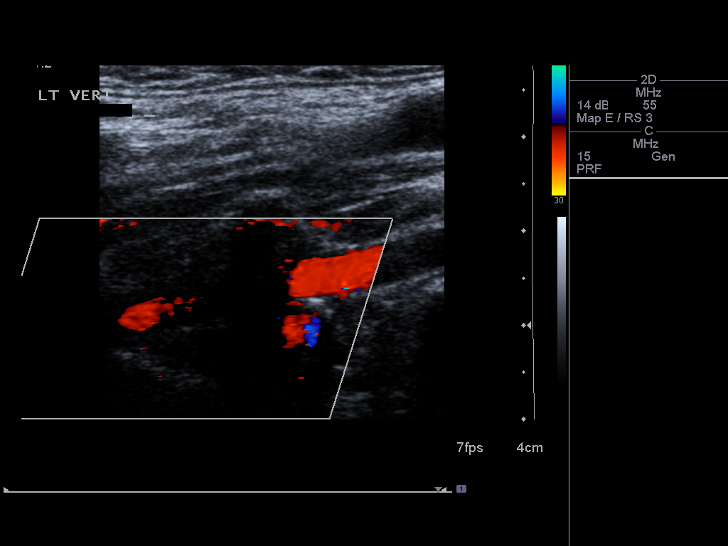

[13 of 24 positions shown; findings below may reference images not displayed]

FINDINGS: Criteria: Quantification of carotid stenosis is based on velocity
parameters that correlate the residual internal carotid diameter
with NASCET-based stenosis levels, using the diameter of the distal
internal carotid lumen as the denominator for stenosis measurement.

The following velocity measurements were obtained:

RIGHT

ICA:  106/48 cm/sec

CCA:  87/22 cm/sec

SYSTOLIC ICA/CCA RATIO:

DIASTOLIC ICA/CCA RATIO:

ECA:  100 cm/sec

LEFT

ICA:  93/37 cm/sec

CCA:  103/22 cm/sec

SYSTOLIC ICA/CCA RATIO:

DIASTOLIC ICA/CCA RATIO:

ECA:  80 cm/sec

RIGHT CAROTID ARTERY: The grayscale images demonstrate mild plaque
in the region of the carotid bulb and proximal internal carotid
artery. The waveforms, velocities and flow velocity ratios however
demonstrate no evidence of focal hemodynamically significant
stenosis.

RIGHT VERTEBRAL ARTERY:  Antegrade

LEFT CAROTID ARTERY: The grayscale images on the left demonstrate
mild plaque formation. Evaluation of the waveforms, velocities and
flow velocity ratios however demonstrate no evidence of focal
hemodynamically significant stenosis.

LEFT VERTEBRAL ARTERY:  Antegrade
IMPRESSION: No evidence of focal carotid stenosis.

## 2016-01-22 IMAGING — RF DG HIP (WITH OR WITHOUT PELVIS) 2-3V*L*
1 series · 2 of 2 positions shown · non-contrast
Comparison: None.

FLUOROSCOPY TIME:  38 seconds

CLINICAL DATA: Post left anterior hip replacement

EXAM:
LEFT HIP - COMPLETE 2+ VIEW

[Series 1: run · 2 of 2 slices shown]
[im 1/2]
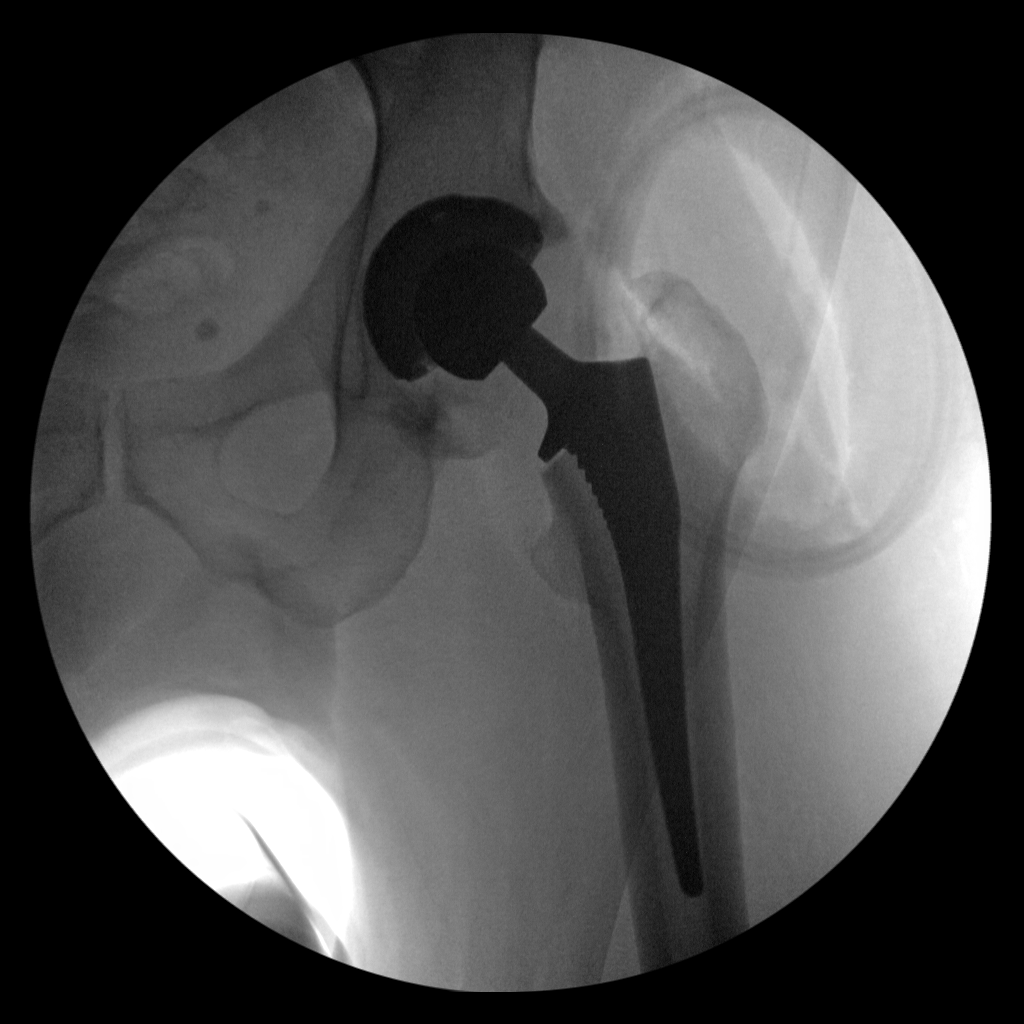
[im 2/2]
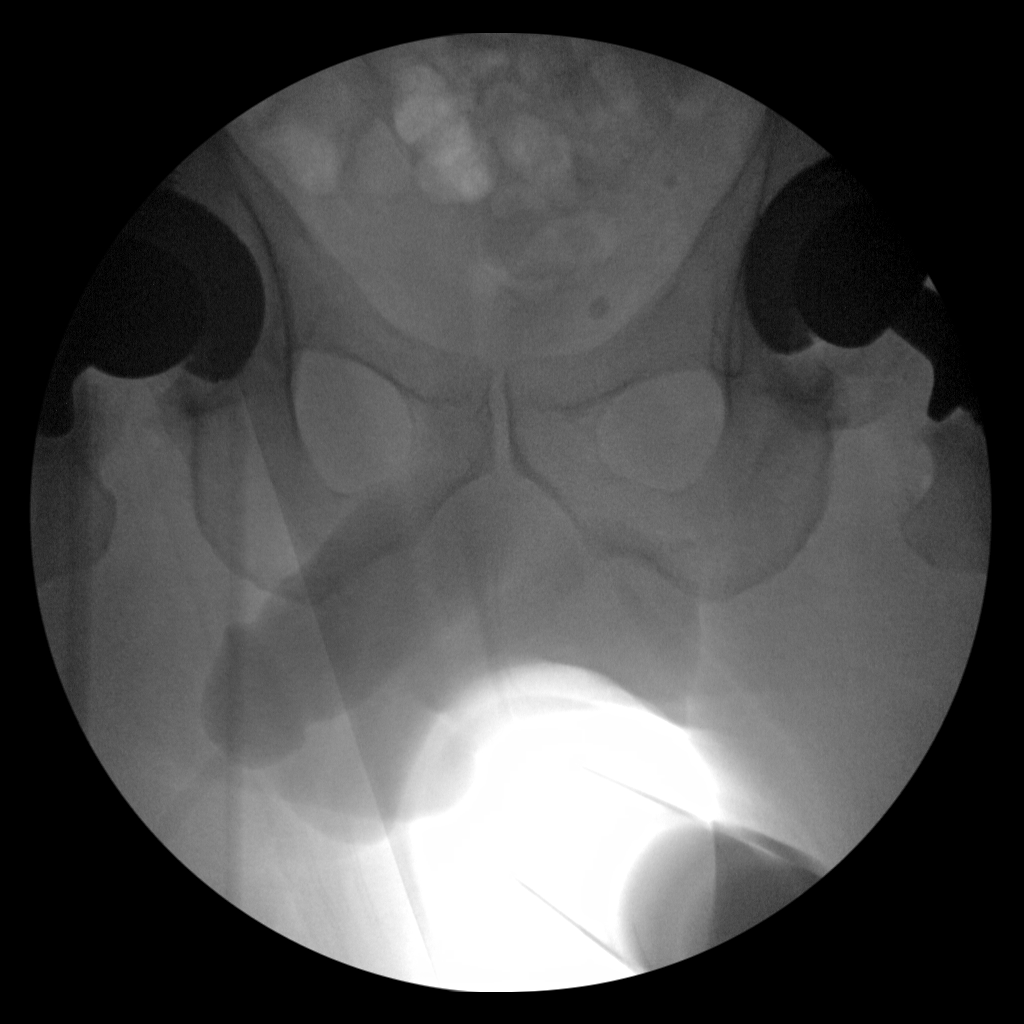

[2 of 2 positions shown; findings below may reference images not displayed]

FINDINGS: Two spot intraoperative fluoroscopic images of the left hip are
provided for review.

Images demonstrate the sequela of left total hip replacement.
Alignment appears anatomic on the solitary AP projection. No
definite fracture. There is expected subcutaneous emphysema about
the operative site.

Post right total hip replacement, incompletely evaluated. Several
phleboliths overlie the left hemipelvis. No radiopaque foreign body.
IMPRESSION: Post left total hip replacement without evidence of complication.

## 2018-12-10 ENCOUNTER — Ambulatory Visit: Payer: Self-pay | Admitting: Surgery

## 2018-12-10 NOTE — H&P (Signed)
Patrick Rivas Documented: 12/10/2018 9:12 AM Location: [facility] Patient #: [id number] DOB: Nov 14, 1950 Married / Language: [place] / Race: White Male  History of Present Illness Patrick Fus A. Mayari Matus MD; 12/10/2018 9:32 AM) Patient words: Patient presents for evaluation of left inguinal bulge. He's had it for a number of months and is getting larger causing discomfort and pain. He is able to push the area back in easily. He has a history of a right inguinal hernia. Mesh back in 2016. This become more painful left side and he desires evaluation. He denies nausea, vomiting, fever, chills.  The patient is a 68 year old male.   Past Surgical History (Tanisha A. Manson Passey, RMA; 12/10/2018 9:12 AM) Cataract Surgery Bilateral. Colon Polyp Removal - Colonoscopy Hip Surgery Bilateral. Laparoscopic Inguinal Hernia Surgery Right. Oral Surgery Tonsillectomy  Diagnostic Studies History (Tanisha A. Manson Passey, RMA; 12/10/2018 9:12 AM) Colonoscopy 1-5 years ago  Allergies (Tanisha A. Manson Passey, RMA; 12/10/2018 9:12 AM) No Known Drug Allergies [01/05/2015]: Allergies Reconciled  Medication History (Tanisha A. Manson Passey, RMA; 12/10/2018 9:13 AM) Diclofenac Sodium (75MG  Tablet DR, Oral two times daily) Active. Vitamin D (1000UNIT Capsule, Oral) Active. Vitamin B Complex (Oral daily) Active. Fish Oil (1000MG  Capsule, Oral two times daily) Active. Joint Health (750-375-30MG  Tablet, Oral daily) Active. Probiotic-Prebiotic (1-250BILLION-MG Capsule, Oral) Active. Medications Reconciled  Social History (Tanisha A. Manson Passey, RMA; 12/10/2018 9:12 AM) Caffeine use Coffee. No alcohol use No drug use Tobacco use Never smoker.  Family History (Tanisha A. Manson Passey, RMA; 12/10/2018 9:12 AM) Cancer Mother. Colon Polyps Father. Hypertension Father.  Other Problems (Tanisha A. Manson Passey, RMA; 12/10/2018 9:12 AM) Arthritis Heart murmur Hypercholesterolemia Inguinal Hernia     Review of  Systems (Tanisha A. Brown RMA; 12/10/2018 9:12 AM) General Not Present- Appetite Loss, Chills, Fatigue, Fever, Night Sweats, Weight Gain and Weight Loss. Skin Not Present- Change in Wart/Mole, Dryness, Hives, Jaundice, New Lesions, Non-Healing Wounds, Rash and Ulcer. Respiratory Not Present- Bloody sputum, Chronic Cough, Difficulty Breathing, Snoring and Wheezing. Breast Not Present- Breast Mass, Breast Pain, Nipple Discharge and Skin Changes. Cardiovascular Not Present- Chest Pain, Difficulty Breathing Lying Down, Leg Cramps, Palpitations, Rapid Heart Rate, Shortness of Breath and Swelling of Extremities. Male Genitourinary Not Present- Blood in Urine, Change in Urinary Stream, Frequency, Impotence, Nocturia, Painful Urination, Urgency and Urine Leakage. Neurological Not Present- Decreased Memory, Fainting, Headaches, Numbness, Seizures, Tingling, Tremor, Trouble walking and Weakness. Endocrine Not Present- Cold Intolerance, Excessive Hunger, Hair Changes, Heat Intolerance, Hot flashes and New Diabetes.  Vitals (Tanisha A. Brown RMA; 12/10/2018 9:12 AM) 12/10/2018 9:12 AM Weight: 204.2 lb Height: 72in Body Surface Area: 2.15 m Body Mass Index: 27.69 kg/m  Temp.: 98.78F  Pulse: 87 (Regular)  BP: 142/84 (Sitting, Left Arm, Standard)      Physical Exam (Jaimie Pippins A. Mikael Debell MD; 12/10/2018 9:32 AM)  General Mental Status-Alert. General Appearance-Consistent with stated age. Hydration-Well hydrated. Voice-Normal.  Head and Neck Head-normocephalic, atraumatic with no lesions or palpable masses. Trachea-midline. Thyroid Gland Characteristics - normal size and consistency.  Chest and Lung Exam Chest and lung exam reveals -quiet, even and easy respiratory effort with no use of accessory muscles and on auscultation, normal breath sounds, no adventitious sounds and normal vocal resonance. Inspection Chest Wall - Normal. Back - normal.  Cardiovascular Cardiovascular  examination reveals -normal heart sounds, regular rate and rhythm with no murmurs and normal pedal pulses bilaterally.  Abdomen Note: Reducible left inguinal hernia. Right groin scar noted without evidence of recurrent right inguinal hernia. Soft nontender without rebound or  guarding.  Neurologic Neurologic evaluation reveals -alert and oriented x 3 with no impairment of recent or remote memory. Mental Status-Normal.  Musculoskeletal Normal Exam - Left-Upper Extremity Strength Normal and Lower Extremity Strength Normal. Normal Exam - Right-Upper Extremity Strength Normal and Lower Extremity Strength Normal.    Assessment & Plan (Immaculate Crutcher A. Jessamyn Watterson MD; 12/10/2018 9:31 AM)  LEFT INGUINAL HERNIA (K40.90) Impression: Patient desires repair of his left inguinal hernia mesh. I prescribed an open techniques discussed.covid risks discussed as well. He wishes to proceed. The risk of hernia repair include bleeding, infection, organ injury, bowel injury, bladder injury, nerve injury recurrent hernia, blood clots, worsening of underlying condition, chronic pain, mesh use, open surgery, death, and the need for other operattions. Pt agrees to proceed  Current Plans You are being scheduled for surgery- Our schedulers will call you.  You should hear from our office's scheduling department within 5 working days about the location, date, and time of surgery. We try to make accommodations for patient's preferences in scheduling surgery, but sometimes the OR schedule or the surgeon's schedule prevents us from making those accommodations.  If you have not heard from our office [phone]) in 5 working days, call the office and ask for your surgeon's nurse.  If you have other questions about your diagnosis, plan, or surgery, call the office and ask for your surgeon's nurse.  Pt Education - Pamphlet Given - Hernia Surgery: discussed with patient and provided information. Pt Education - CCS Mesh  education: discussed with patient and provided information. Pt Education - Consent for inguinal hernia - [place]: discussed with patient and provided information.

## 2019-01-23 ENCOUNTER — Encounter (HOSPITAL_BASED_OUTPATIENT_CLINIC_OR_DEPARTMENT_OTHER): Payer: Self-pay

## 2019-01-24 ENCOUNTER — Other Ambulatory Visit: Payer: Self-pay

## 2019-01-24 ENCOUNTER — Encounter (HOSPITAL_BASED_OUTPATIENT_CLINIC_OR_DEPARTMENT_OTHER): Payer: Self-pay

## 2019-01-26 ENCOUNTER — Other Ambulatory Visit (HOSPITAL_COMMUNITY)
Admission: RE | Admit: 2019-01-26 | Discharge: 2019-01-26 | Disposition: A | Discharge status: Home / Self Care | Payer: BC Managed Care – PPO | Source: Ambulatory Visit | Attending: Surgery | Admitting: Surgery

## 2019-01-26 DIAGNOSIS — Z1159 Encounter for screening for other viral diseases: Secondary | ICD-10-CM | POA: Insufficient documentation

## 2019-01-26 LAB — SARS CORONAVIRUS 2 (TAT 6-24 HRS): SARS Coronavirus 2: NEGATIVE

## 2019-01-28 ENCOUNTER — Other Ambulatory Visit: Payer: Self-pay

## 2019-01-28 ENCOUNTER — Other Ambulatory Visit (HOSPITAL_COMMUNITY): Payer: Self-pay

## 2019-01-28 ENCOUNTER — Encounter (HOSPITAL_BASED_OUTPATIENT_CLINIC_OR_DEPARTMENT_OTHER)
Admission: RE | Admit: 2019-01-28 | Discharge: 2019-01-28 | Disposition: A | Discharge status: Home / Self Care | Payer: BC Managed Care – PPO | Source: Ambulatory Visit | Attending: Surgery | Admitting: Surgery

## 2019-01-28 DIAGNOSIS — Z01812 Encounter for preprocedural laboratory examination: Secondary | ICD-10-CM | POA: Insufficient documentation

## 2019-01-28 LAB — CBC WITH DIFFERENTIAL/PLATELET
Abs Immature Granulocytes: 0.01 10*3/uL (ref 0.00–0.07)
Basophils Absolute: 0.1 10*3/uL (ref 0.0–0.1)
Basophils Relative: 1 %
Eosinophils Absolute: 0.4 10*3/uL (ref 0.0–0.5)
Eosinophils Relative: 9 %
HCT: 44 % (ref 39.0–52.0)
Hemoglobin: 15.1 g/dL (ref 13.0–17.0)
Immature Granulocytes: 0 %
Lymphocytes Relative: 33 %
Lymphs Abs: 1.4 10*3/uL (ref 0.7–4.0)
MCH: 34.2 pg — ABNORMAL HIGH (ref 26.0–34.0)
MCHC: 34.3 g/dL (ref 30.0–36.0)
MCV: 99.5 fL (ref 80.0–100.0)
Monocytes Absolute: 0.5 10*3/uL (ref 0.1–1.0)
Monocytes Relative: 11 %
Neutro Abs: 2 10*3/uL (ref 1.7–7.7)
Neutrophils Relative %: 46 %
Platelets: 206 10*3/uL (ref 150–400)
RBC: 4.42 MIL/uL (ref 4.22–5.81)
RDW: 12.4 % (ref 11.5–15.5)
WBC: 4.4 10*3/uL (ref 4.0–10.5)
nRBC: 0 % (ref 0.0–0.2)

## 2019-01-28 LAB — COMPREHENSIVE METABOLIC PANEL
ALT: 16 U/L (ref 0–44)
AST: 25 U/L (ref 15–41)
Albumin: 3.9 g/dL (ref 3.5–5.0)
Alkaline Phosphatase: 58 U/L (ref 38–126)
Anion gap: 6 (ref 5–15)
BUN: 14 mg/dL (ref 8–23)
CO2: 26 mmol/L (ref 22–32)
Calcium: 9.6 mg/dL (ref 8.9–10.3)
Chloride: 108 mmol/L (ref 98–111)
Creatinine, Ser: 0.88 mg/dL (ref 0.61–1.24)
GFR calc Af Amer: >60 mL/min (ref >60)
GFR calc non Af Amer: >60 mL/min (ref >60)
Glucose, Bld: 80 mg/dL (ref 70–99)
Potassium: 5.6 mmol/L — ABNORMAL HIGH (ref 3.5–5.1)
Sodium: 140 mmol/L (ref 135–145)
Total Bilirubin: 0.9 mg/dL (ref 0.3–1.2)
Total Protein: 6.4 g/dL — ABNORMAL LOW (ref 6.5–8.1)

## 2019-01-28 NOTE — Pre-Procedure Instructions (Signed)
Labs were collected and patient received Ensure drink.  Informed patient to complete by 4:15 am

## 2019-01-29 NOTE — Anesthesia Preprocedure Evaluation (Addendum)
Anesthesia Evaluation  Patient identified by MRN, date of birth, ID band Patient awake    Reviewed: Allergy & Precautions, NPO status , Patient's Chart, lab work & pertinent test results  Airway Mallampati: III  TM Distance: >3 FB Neck ROM: Full    Dental no notable dental hx.    Pulmonary former smoker,    Pulmonary exam normal breath sounds clear to auscultation       Cardiovascular negative cardio ROS Normal cardiovascular exam Rhythm:Regular Rate:Normal     Neuro/Psych negative neurological ROS  negative psych ROS   GI/Hepatic negative GI ROS, Neg liver ROS,   Endo/Other  negative endocrine ROS  Renal/GU negative Renal ROS     Musculoskeletal negative musculoskeletal ROS (+)   Abdominal   Peds  Hematology HLD   Anesthesia Other Findings LEFT INGUINAL HERNIA  Reproductive/Obstetrics                            Anesthesia Physical Anesthesia Plan  ASA: II  Anesthesia Plan: General and Regional   Post-op Pain Management: GA combined w/ Regional for post-op pain   Induction: Intravenous  PONV Risk Score and Plan: 2 and Ondansetron, Dexamethasone, Midazolam and Treatment may vary due to age or medical condition  Airway Management Planned: LMA and Oral ETT  Additional Equipment:   Intra-op Plan:   Post-operative Plan: Extubation in OR  Informed Consent: I have reviewed the patients History and Physical, chart, labs and discussed the procedure including the risks, benefits and alternatives for the proposed anesthesia with the patient or authorized representative who has indicated his/her understanding and acceptance.     Dental advisory given  Plan Discussed with: CRNA  Anesthesia Plan Comments:        Anesthesia Quick Evaluation

## 2019-01-29 NOTE — Progress Notes (Signed)
Lab results reviewed with Dr.Miller, will proceed with surgery as scheduled.

## 2019-01-30 ENCOUNTER — Encounter (HOSPITAL_BASED_OUTPATIENT_CLINIC_OR_DEPARTMENT_OTHER)
Admission: RE | Disposition: A | Discharge status: Home / Self Care | Payer: Self-pay | Source: Home / Self Care | Attending: Surgery

## 2019-01-30 ENCOUNTER — Ambulatory Visit (HOSPITAL_BASED_OUTPATIENT_CLINIC_OR_DEPARTMENT_OTHER): Payer: BC Managed Care – PPO | Admitting: Anesthesiology

## 2019-01-30 ENCOUNTER — Encounter (HOSPITAL_BASED_OUTPATIENT_CLINIC_OR_DEPARTMENT_OTHER): Payer: Self-pay | Admitting: *Deleted

## 2019-01-30 ENCOUNTER — Other Ambulatory Visit: Payer: Self-pay

## 2019-01-30 ENCOUNTER — Ambulatory Visit (HOSPITAL_BASED_OUTPATIENT_CLINIC_OR_DEPARTMENT_OTHER)
Admission: RE | Admit: 2019-01-30 | Discharge: 2019-01-30 | Disposition: A | Discharge status: Home / Self Care | Payer: BC Managed Care – PPO | Attending: Surgery | Admitting: Surgery

## 2019-01-30 DIAGNOSIS — Z87891 Personal history of nicotine dependence: Secondary | ICD-10-CM | POA: Diagnosis not present

## 2019-01-30 DIAGNOSIS — K4090 Unilateral inguinal hernia, without obstruction or gangrene, not specified as recurrent: Secondary | ICD-10-CM | POA: Insufficient documentation

## 2019-01-30 HISTORY — PX: INGUINAL HERNIA REPAIR: SHX194

## 2019-01-30 SURGERY — REPAIR, HERNIA, INGUINAL, ADULT
Anesthesia: Regional | Site: Groin | Laterality: Left

## 2019-01-30 MED ORDER — FENTANYL CITRATE (PF) 100 MCG/2ML IJ SOLN: 25.0000 ug | INTRAMUSCULAR | Status: DC | PRN

## 2019-01-30 MED ORDER — ROPIVACAINE HCL 5 MG/ML IJ SOLN
INTRAMUSCULAR | Status: DC | PRN
  Administered 2019-01-30: 30 mL via PERINEURAL

## 2019-01-30 MED ORDER — PHENYLEPHRINE 40 MCG/ML (10ML) SYRINGE FOR IV PUSH (FOR BLOOD PRESSURE SUPPORT)
PREFILLED_SYRINGE | INTRAVENOUS | Status: AC
  Filled 2019-01-30: qty 10

## 2019-01-30 MED ORDER — ONDANSETRON HCL 4 MG/2ML IJ SOLN
INTRAMUSCULAR | Status: DC | PRN
  Administered 2019-01-30: 4 mg via INTRAVENOUS

## 2019-01-30 MED ORDER — ONDANSETRON HCL 4 MG/2ML IJ SOLN
INTRAMUSCULAR | Status: AC
  Filled 2019-01-30: qty 2

## 2019-01-30 MED ORDER — FENTANYL CITRATE (PF) 100 MCG/2ML IJ SOLN
INTRAMUSCULAR | Status: AC
  Filled 2019-01-30: qty 2

## 2019-01-30 MED ORDER — MIDAZOLAM HCL 2 MG/2ML IJ SOLN
INTRAMUSCULAR | Status: AC
  Filled 2019-01-30: qty 2

## 2019-01-30 MED ORDER — GABAPENTIN 300 MG PO CAPS
300.0000 mg | ORAL_CAPSULE | ORAL | Status: AC
  Administered 2019-01-30: 07:00:00 300 mg via ORAL

## 2019-01-30 MED ORDER — CHLORHEXIDINE GLUCONATE CLOTH 2 % EX PADS: 6.0000 | MEDICATED_PAD | Freq: Once | CUTANEOUS | Status: DC

## 2019-01-30 MED ORDER — BUPIVACAINE-EPINEPHRINE (PF) 0.25% -1:200000 IJ SOLN
INTRAMUSCULAR | Status: AC
  Filled 2019-01-30: qty 60

## 2019-01-30 MED ORDER — EPHEDRINE SULFATE 50 MG/ML IJ SOLN
INTRAMUSCULAR | Status: DC | PRN
  Administered 2019-01-30 (×3): 10 mg via INTRAVENOUS

## 2019-01-30 MED ORDER — CEFAZOLIN SODIUM-DEXTROSE 2-4 GM/100ML-% IV SOLN
INTRAVENOUS | Status: AC
  Filled 2019-01-30: qty 100

## 2019-01-30 MED ORDER — GLYCOPYRROLATE PF 0.2 MG/ML IJ SOSY
PREFILLED_SYRINGE | INTRAMUSCULAR | Status: AC
  Filled 2019-01-30: qty 1

## 2019-01-30 MED ORDER — CEFAZOLIN SODIUM-DEXTROSE 2-4 GM/100ML-% IV SOLN
2.0000 g | INTRAVENOUS | Status: AC
  Administered 2019-01-30: 2 g via INTRAVENOUS

## 2019-01-30 MED ORDER — DEXAMETHASONE SODIUM PHOSPHATE 4 MG/ML IJ SOLN
INTRAMUSCULAR | Status: DC | PRN
  Administered 2019-01-30: 10 mg via INTRAVENOUS

## 2019-01-30 MED ORDER — SUCCINYLCHOLINE CHLORIDE 20 MG/ML IJ SOLN
INTRAMUSCULAR | Status: DC | PRN
  Administered 2019-01-30: 50 mg via INTRAVENOUS

## 2019-01-30 MED ORDER — ONDANSETRON HCL 4 MG/2ML IJ SOLN: 4.0000 mg | Freq: Once | INTRAMUSCULAR | Status: DC | PRN

## 2019-01-30 MED ORDER — GABAPENTIN 300 MG PO CAPS
ORAL_CAPSULE | ORAL | Status: AC
  Filled 2019-01-30: qty 1

## 2019-01-30 MED ORDER — DEXAMETHASONE SODIUM PHOSPHATE 10 MG/ML IJ SOLN
INTRAMUSCULAR | Status: AC
  Filled 2019-01-30: qty 1

## 2019-01-30 MED ORDER — PROPOFOL 10 MG/ML IV BOLUS
INTRAVENOUS | Status: DC | PRN
  Administered 2019-01-30: 150 mg via INTRAVENOUS
  Administered 2019-01-30 (×2): 20 mg via INTRAVENOUS

## 2019-01-30 MED ORDER — MIDAZOLAM HCL 5 MG/5ML IJ SOLN
INTRAMUSCULAR | Status: DC | PRN
  Administered 2019-01-30: 2 mg via INTRAVENOUS

## 2019-01-30 MED ORDER — OXYCODONE HCL 5 MG PO TABS: 5.0000 mg | ORAL_TABLET | Freq: Four times a day (QID) | ORAL | 0 refills | Status: AC | PRN

## 2019-01-30 MED ORDER — FENTANYL CITRATE (PF) 100 MCG/2ML IJ SOLN
INTRAMUSCULAR | Status: DC | PRN
  Administered 2019-01-30: 50 ug via INTRAVENOUS

## 2019-01-30 MED ORDER — MIDAZOLAM HCL 2 MG/2ML IJ SOLN
1.0000 mg | INTRAMUSCULAR | Status: DC | PRN
  Administered 2019-01-30: 2 mg via INTRAVENOUS

## 2019-01-30 MED ORDER — GLYCOPYRROLATE 0.2 MG/ML IJ SOLN
INTRAMUSCULAR | Status: DC | PRN
  Administered 2019-01-30: 0.1 mg via INTRAVENOUS

## 2019-01-30 MED ORDER — BUPIVACAINE-EPINEPHRINE 0.25% -1:200000 IJ SOLN
INTRAMUSCULAR | Status: DC | PRN
  Administered 2019-01-30: 10 mL

## 2019-01-30 MED ORDER — PHENYLEPHRINE HCL (PRESSORS) 10 MG/ML IV SOLN
INTRAVENOUS | Status: DC | PRN
  Administered 2019-01-30 (×2): 80 ug via INTRAVENOUS

## 2019-01-30 MED ORDER — PROPOFOL 10 MG/ML IV BOLUS
INTRAVENOUS | Status: AC
  Filled 2019-01-30: qty 20

## 2019-01-30 MED ORDER — ACETAMINOPHEN 500 MG PO TABS
1000.0000 mg | ORAL_TABLET | ORAL | Status: AC
  Administered 2019-01-30: 1000 mg via ORAL

## 2019-01-30 MED ORDER — LACTATED RINGERS IV SOLN
INTRAVENOUS | Status: DC
  Administered 2019-01-30 (×3): via INTRAVENOUS

## 2019-01-30 MED ORDER — LIDOCAINE 2% (20 MG/ML) 5 ML SYRINGE
INTRAMUSCULAR | Status: AC
  Filled 2019-01-30: qty 5

## 2019-01-30 MED ORDER — FENTANYL CITRATE (PF) 100 MCG/2ML IJ SOLN
50.0000 ug | INTRAMUSCULAR | Status: DC | PRN
  Administered 2019-01-30: 100 ug via INTRAVENOUS

## 2019-01-30 MED ORDER — SUCCINYLCHOLINE CHLORIDE 200 MG/10ML IV SOSY
PREFILLED_SYRINGE | INTRAVENOUS | Status: AC
  Filled 2019-01-30: qty 10

## 2019-01-30 MED ORDER — EPHEDRINE 5 MG/ML INJ
INTRAVENOUS | Status: AC
  Filled 2019-01-30: qty 10

## 2019-01-30 MED ORDER — LIDOCAINE 2% (20 MG/ML) 5 ML SYRINGE
INTRAMUSCULAR | Status: DC | PRN
  Administered 2019-01-30: 50 mg via INTRAVENOUS

## 2019-01-30 MED ORDER — ACETAMINOPHEN 500 MG PO TABS
ORAL_TABLET | ORAL | Status: AC
  Filled 2019-01-30: qty 2

## 2019-01-30 MED ORDER — IBUPROFEN 800 MG PO TABS: 800.0000 mg | ORAL_TABLET | Freq: Three times a day (TID) | ORAL | 0 refills | Status: AC | PRN

## 2019-01-30 SURGICAL SUPPLY — 48 items
BLADE CLIPPER SURG (BLADE) ×3 IMPLANT
BLADE SURG 15 STRL LF DISP TIS (BLADE) ×1 IMPLANT
BLADE SURG 15 STRL SS (BLADE) ×2
CANISTER SUCT 1200ML W/VALVE (MISCELLANEOUS) IMPLANT
CHLORAPREP W/TINT 26 (MISCELLANEOUS) ×3 IMPLANT
COVER BACK TABLE REUSABLE LG (DRAPES) ×3 IMPLANT
COVER MAYO STAND REUSABLE (DRAPES) ×3 IMPLANT
COVER WAND RF STERILE (DRAPES) IMPLANT
DECANTER SPIKE VIAL GLASS SM (MISCELLANEOUS) IMPLANT
DERMABOND ADVANCED (GAUZE/BANDAGES/DRESSINGS) ×2
DERMABOND ADVANCED .7 DNX12 (GAUZE/BANDAGES/DRESSINGS) ×1 IMPLANT
DRAIN PENROSE 1/2X12 LTX STRL (WOUND CARE) ×3 IMPLANT
DRAPE LAPAROTOMY TRNSV 102X78 (DRAPES) ×3 IMPLANT
DRAPE UTILITY XL STRL (DRAPES) ×3 IMPLANT
ELECT COATED BLADE 2.86 ST (ELECTRODE) ×3 IMPLANT
ELECT REM PT RETURN 9FT ADLT (ELECTROSURGICAL) ×3
ELECTRODE REM PT RTRN 9FT ADLT (ELECTROSURGICAL) ×1 IMPLANT
GAUZE 4X4 16PLY RFD (DISPOSABLE) IMPLANT
GAUZE SPONGE 4X4 12PLY STRL LF (GAUZE/BANDAGES/DRESSINGS) IMPLANT
GLOVE BIOGEL PI IND STRL 7.0 (GLOVE) ×2 IMPLANT
GLOVE BIOGEL PI IND STRL 8 (GLOVE) ×1 IMPLANT
GLOVE BIOGEL PI INDICATOR 7.0 (GLOVE) ×4
GLOVE BIOGEL PI INDICATOR 8 (GLOVE) ×2
GLOVE ECLIPSE 6.5 STRL STRAW (GLOVE) ×3 IMPLANT
GLOVE ECLIPSE 8.0 STRL XLNG CF (GLOVE) ×3 IMPLANT
GOWN STRL REUS W/ TWL LRG LVL3 (GOWN DISPOSABLE) ×2 IMPLANT
GOWN STRL REUS W/TWL LRG LVL3 (GOWN DISPOSABLE) ×4
MESH HERNIA SYS ULTRAPRO LRG (Mesh General) ×3 IMPLANT
NEEDLE HYPO 25X1 1.5 SAFETY (NEEDLE) ×3 IMPLANT
NS IRRIG 1000ML POUR BTL (IV SOLUTION) ×3 IMPLANT
PACK BASIN DAY SURGERY FS (CUSTOM PROCEDURE TRAY) ×3 IMPLANT
PENCIL BUTTON HOLSTER BLD 10FT (ELECTRODE) ×3 IMPLANT
SLEEVE SCD COMPRESS KNEE MED (MISCELLANEOUS) ×3 IMPLANT
SPONGE LAP 4X18 RFD (DISPOSABLE) ×3 IMPLANT
SUT MON AB 4-0 PC3 18 (SUTURE) ×3 IMPLANT
SUT NOVA 0 T19/GS 22DT (SUTURE) ×6 IMPLANT
SUT VIC AB 2-0 SH 27 (SUTURE) ×2
SUT VIC AB 2-0 SH 27XBRD (SUTURE) ×1 IMPLANT
SUT VIC AB 3-0 54X BRD REEL (SUTURE) IMPLANT
SUT VIC AB 3-0 BRD 54 (SUTURE)
SUT VICRYL 3-0 CR8 SH (SUTURE) ×3 IMPLANT
SUT VICRYL AB 2 0 TIE (SUTURE) IMPLANT
SUT VICRYL AB 2 0 TIES (SUTURE)
SYR CONTROL 10ML LL (SYRINGE) ×3 IMPLANT
TOWEL GREEN STERILE FF (TOWEL DISPOSABLE) ×3 IMPLANT
TUBE CONNECTING 20'X1/4 (TUBING)
TUBE CONNECTING 20X1/4 (TUBING) IMPLANT
YANKAUER SUCT BULB TIP NO VENT (SUCTIONS) IMPLANT

## 2019-01-30 NOTE — Transfer of Care (Signed)
Immediate Anesthesia Transfer of Care Note  Patient: Patrick Rivas  Procedure(s) Performed: LEFT INGUINAL HERNIA REPAIR WITH MESH (Left Groin)  Patient Location: PACU  Anesthesia Type:GA combined with regional for post-op pain  Level of Consciousness: sedated  Airway & Oxygen Therapy: Patient Spontanous Breathing and Patient connected to face mask oxygen  Post-op Assessment: Report given to RN and Post -op Vital signs reviewed and stable  Post vital signs: Reviewed and stable  Last Vitals:  Vitals Value Taken Time  BP 129/87 01/30/19 1022  Temp    Pulse 86 01/30/19 1023  Resp 12 01/30/19 1023  SpO2 100 % 01/30/19 1023  Vitals shown include unvalidated device data.  Last Pain:  Vitals:   01/30/19 0700  TempSrc: Oral  PainSc: 0-No pain      Patients Stated Pain Goal: 0 (96/43/83 8184)  Complications: No apparent anesthesia complications

## 2019-01-30 NOTE — Anesthesia Postprocedure Evaluation (Signed)
Anesthesia Post Note  Patient: Patrick Rivas  Procedure(s) Performed: LEFT INGUINAL HERNIA REPAIR WITH MESH (Left Groin)     Patient location during evaluation: PACU Anesthesia Type: Regional and General Level of consciousness: awake and alert Pain management: pain level controlled Vital Signs Assessment: post-procedure vital signs reviewed and stable Respiratory status: spontaneous breathing, nonlabored ventilation, respiratory function stable and patient connected to nasal cannula oxygen Cardiovascular status: blood pressure returned to baseline and stable Postop Assessment: no apparent nausea or vomiting Anesthetic complications: no    Last Vitals:  Vitals:   01/30/19 1055 01/30/19 1115  BP: 137/81 132/89  Pulse: 87 84  Resp: 11 18  Temp:  36.6 C  SpO2: 99% 98%    Last Pain:  Vitals:   01/30/19 1115  TempSrc:   PainSc: 0-No pain                 Thurmon Mizell P Izaiha Lo

## 2019-01-30 NOTE — Discharge Instructions (Signed)
[facility] _______[facility] ° °UMBILICAL OR INGUINAL HERNIA REPAIR: POST OP INSTRUCTIONS ° °Always review your discharge instruction sheet given to you by the facility where your surgery was performed. °IF YOU HAVE DISABILITY OR FAMILY LEAVE FORMS, YOU MUST BRING THEM TO THE OFFICE FOR PROCESSING.   °DO NOT GIVE THEM TO YOUR DOCTOR. ° °1. A  prescription for pain medication may be given to you upon discharge.  Take your pain medication as prescribed, if needed.  If narcotic pain medicine is not needed, then you may take acetaminophen (Tylenol) or ibuprofen (Advil) as needed. °2. Take your usually prescribed medications unless otherwise directed. °If you need a refill on your pain medication, please contact your pharmacy.  They will contact our office to request authorization. Prescriptions will not be filled after 5 pm or on week-ends. °3. You should follow a light diet the first 24 hours after arrival home, such as soup and crackers, etc.  Be sure to include lots of fluids daily.  Resume your normal diet the day after surgery. °4.Most patients will experience some swelling and bruising around the umbilicus or in the groin and scrotum.  Ice packs and reclining will help.  Swelling and bruising can take several days to resolve.  °6. It is common to experience some constipation if taking pain medication after surgery.  Increasing fluid intake and taking a stool softener (such as Colace) will usually help or prevent this problem from occurring.  A mild laxative (Milk of Magnesia or Miralax) should be taken according to package directions if there are no bowel movements after 48 hours. °7. Unless discharge instructions indicate otherwise, you may remove your bandages 24-48 hours after surgery, and you may shower at that time.  You may have steri-strips (small skin tapes) in place directly over the incision.  These strips should be left on the skin for 7-10 days.  If your surgeon used skin glue on the  incision, you may shower in 24 hours.  The glue will flake off over the next 2-3 [place].  Any sutures or staples will be removed at the office during your follow-up visit. °8. ACTIVITIES:  You may resume regular (light) daily activities beginning the next day--such as daily self-care, walking, climbing stairs--gradually increasing activities as tolerated.  You may have sexual intercourse when it is comfortable.  Refrain from any heavy lifting or straining until approved by your doctor. ° °a.You may drive when you are no longer taking prescription pain medication, you can comfortably wear a seatbelt, and you can safely maneuver your car and apply brakes. °b.RETURN TO WORK:   °_____________________________________________ ° °9.You should see your doctor in the office for a follow-up appointment approximately 2-3 [place] after your surgery.  Make sure that you call for this appointment within a day or two after you arrive home to insure a convenient appointment time. °10.OTHER INSTRUCTIONS: _________________________ °   _____________________________________ ° °WHEN TO CALL YOUR DOCTOR: °1. Fever over 101.0 °2. Inability to urinate °3. Nausea and/or vomiting °4. Extreme swelling or bruising °5. Continued bleeding from incision. °6. Increased pain, redness, or drainage from the incision ° °The clinic staff is available to answer your questions during regular business hours.  Please don’t hesitate to call and ask to speak to one of the nurses for clinical concerns.  If you have a medical emergency, go to the nearest emergency room or call 911.  A surgeon from [facility] is always on call at the hospital ° ° °  [address], [address], [place], [place]  [address] ? ° [address] [address], [place], [place]   [address] °[phone] ? [phone] ? FAX [phone] °Web site: [email or web address] ° ° ° ° °Post Anesthesia Home Care Instructions ° °Activity: °Get plenty of rest for the remainder of the day.  A responsible individual must stay with you for 24 hours following the procedure.  °For the next 24 hours, DO NOT: °-Drive a car °-Operate machinery °-Drink alcoholic beverages °-Take any medication unless instructed by your physician °-Make any legal decisions or sign important papers. ° °Meals: °Start with liquid foods such as gelatin or soup. Progress to regular foods as tolerated. Avoid greasy, spicy, heavy foods. If nausea and/or vomiting occur, drink only clear liquids until the nausea and/or vomiting subsides. Call your physician if vomiting continues. ° °Special Instructions/Symptoms: °Your throat may feel dry or sore from the anesthesia or the breathing tube placed in your throat during surgery. If this causes discomfort, gargle with warm salt water. The discomfort should disappear within 24 hours. ° °If you had a scopolamine patch placed behind your ear for the management of post- operative nausea and/or vomiting: ° °1. The medication in the patch is effective for 72 hours, after which it should be removed.  Wrap patch in a tissue and discard in the trash. Wash hands thoroughly with soap and water. °2. You may remove the patch earlier than 72 hours if you experience unpleasant side effects which may include dry mouth, dizziness or visual disturbances. °3. Avoid touching the patch. Wash your hands with soap and water after contact with the patch. °  ° ° °Regional Anesthesia Blocks ° °1. Numbness or the inability to move the "blocked" extremity may last from 3-48 hours after placement. The length of time depends on the medication injected and your individual response to the medication. If the numbness is not going away after 48 hours, call your surgeon. ° °2. The extremity that is blocked will need to be protected until the numbness is gone and the  Strength has returned. Because you cannot feel it, you will need to take extra care to avoid injury. Because it may be weak, you may have difficulty moving it or  using it. You may not know what position it is in without looking at it while the block is in effect. ° °3. For blocks in the legs and feet, returning to weight bearing and walking needs to be done carefully. You will need to wait until the numbness is entirely gone and the strength has returned. You should be able to move your leg and foot normally before you try and bear weight or walk. You will need someone to be with you when you first try to ensure you do not fall and possibly risk injury. ° °4. Bruising and tenderness at the needle site are common side effects and will resolve in a few days. ° °5. Persistent numbness or new problems with movement should be communicated to the surgeon or the [facility] ([phone])/ [facility] ([phone]). °

## 2019-01-30 NOTE — H&P (Signed)
Patrick FootEdward Rivas Documented:  Location: [facility] Patient #: [id number] DOB: [birth year] Married / Language: English / Race: White Male  History of Present Illness Patient words: Patient presents for evaluation of left inguinal bulge. He's had it for a number of months and is getting larger causing discomfort and pain. He is able to push the area back in easily. He has a history of a right inguinal hernia. Mesh back in 2016. This become more painful left side and he desires evaluation. He denies nausea, vomiting, fever, chills.  The patient is a 68 year old male.   Past Surgical History  Cataract Surgery Bilateral. Colon Polyp Removal - Colonoscopy Hip Surgery Bilateral. Laparoscopic Inguinal Hernia Surgery Right. Oral Surgery Tonsillectomy  Diagnostic Studies History  Colonoscopy 1-5 years ago  Allergies  No Known Drug Allergies [01/05/2015]: Allergies Reconciled  Medication History  Diclofenac Sodium (75MG  Tablet DR, Oral two times daily) Active. Vitamin D (1000UNIT Capsule, Oral) Active. Vitamin B Complex (Oral daily) Active. Fish Oil (1000MG  Capsule, Oral two times daily) Active. Joint Health (750-375-30MG  Tablet, Oral daily) Active. Probiotic-Prebiotic (1-250BILLION-MG Capsule, Oral) Active. Medications Reconciled  Social History  Caffeine use Coffee. No alcohol use No drug use Tobacco use Never smoker.  Family History  Cancer Mother. Colon Polyps Father. Hypertension Father.  Other Problems  Arthritis Heart murmur Hypercholesterolemia Inguinal Hernia     Review of Systems  General Not Present- Appetite Loss, Chills, Fatigue, Fever, Night Sweats, Weight Gain and Weight Loss. Skin Not Present- Change in Wart/Mole, Dryness, Hives, Jaundice, New Lesions, Non-Healing Wounds, Rash and Ulcer. Respiratory Not Present- Bloody sputum, Chronic Cough, Difficulty Breathing, Snoring and Wheezing. Breast Not  Present- Breast Mass, Breast Pain, Nipple Discharge and Skin Changes. Cardiovascular Not Present- Chest Pain, Difficulty Breathing Lying Down, Leg Cramps, Palpitations, Rapid Heart Rate, Shortness of Breath and Swelling of Extremities. Male Genitourinary Not Present- Blood in Urine, Change in Urinary Stream, Frequency, Impotence, Nocturia, Painful Urination, Urgency and Urine Leakage. Neurological Not Present- Decreased Memory, Fainting, Headaches, Numbness, Seizures, Tingling, Tremor, Trouble walking and Weakness. Endocrine Not Present- Cold Intolerance, Excessive Hunger, Hair Changes, Heat Intolerance, Hot flashes and New Diabetes.  Vitals  12/10/2018 9:12 AM Weight: 204.2 lb Height: 72in Body Surface Area: 2.15 m Body Mass Index: 27.69 kg/m  Temp.: 98.17F  Pulse: 87 (Regular)  BP: 142/84 (Sitting, Left Arm, Standard)      Physical Exam   General Mental Status-Alert. General Appearance-Consistent with stated age. Hydration-Well hydrated. Voice-Normal.  Head and Neck Head-normocephalic, atraumatic with no lesions or palpable masses. Trachea-midline. Thyroid Gland Characteristics - normal size and consistency.  Chest and Lung Exam Chest and lung exam reveals -quiet, even and easy respiratory effort with no use of accessory muscles and on auscultation, normal breath sounds, no adventitious sounds and normal vocal resonance. Inspection Chest Wall - Normal. Back - normal.  Cardiovascular Cardiovascular examination reveals -normal heart sounds, regular rate and rhythm with no murmurs and normal pedal pulses bilaterally.  Abdomen Note: Reducible left inguinal hernia. Right groin scar noted without evidence of recurrent right inguinal hernia. Soft nontender without rebound or guarding.  Neurologic Neurologic evaluation reveals -alert and oriented x 3 with no impairment of recent or remote memory. Mental  Status-Normal.  Musculoskeletal Normal Exam - Left-Upper Extremity Strength Normal and Lower Extremity Strength Normal. Normal Exam - Right-Upper Extremity Strength Normal and Lower Extremity Strength Normal.    Assessment & Plan  LEFT INGUINAL HERNIA (K40.90) Impression: Patient desires repair of his left inguinal hernia mesh. I  prescribed an open techniques discussed.covid risks discussed as well. He wishes to proceed. The risk of hernia repair include bleeding, infection, organ injury, bowel injury, bladder injury, nerve injury recurrent hernia, blood clots, worsening of underlying condition, chronic pain, mesh use, open surgery, death, and the need for other operattions. Pt agrees to proceed  Current Plans You are being scheduled for surgery- Our schedulers will call you.  You should hear from our office's scheduling department within 5 working days about the location, date, and time of surgery. We try to make accommodations for patient's preferences in scheduling surgery, but sometimes the OR schedule or the surgeon's schedule prevents [place] from making those accommodations.  If you have not heard from our office [phone]) in 5 working days, call the office and ask for your surgeon's nurse.  If you have other questions about your diagnosis, plan, or surgery, call the office and ask for your surgeon's nurse.  Pt Education - Pamphlet Given - Hernia Surgery: discussed with patient and provided information. Pt Education - [facility] Mesh education: discussed with patient and provided information. Pt Education - Consent for inguinal hernia  discussed with patient and provided information.

## 2019-01-30 NOTE — Interval H&P Note (Signed)
History and Physical Interval Note:  01/30/2019 8:17 AM  Patrick Rivas  has presented today for surgery, with the diagnosis of LEFT INGUINAL HERNIA.  The various methods of treatment have been discussed with the patient and family. After consideration of risks, benefits and other options for treatment, the patient has consented to  Procedure(s) with comments: LEFT INGUINAL HERNIA REPAIR WITH MESH (Left) - GENERAL AND TAP BLOCK as a surgical intervention.  The patient's history has been reviewed, patient examined, no change in status, stable for surgery.  I have reviewed the patient's chart and labs.  Questions were answered to the patient's satisfaction.     [place]

## 2019-01-30 NOTE — Op Note (Signed)
Left inguinal hernia, Open, Procedure Note with ultra Pro mesh  Indications: The patient presented with a history of a left, reducible inguinal hernia.  It is causing pain if he desires repair.  He has to constantly reduce it.  Risks and benefits of surgery were discussed with the patient versus surgical techniques and use of mesh.  Laparoscopic and open techniques discussed.  COVID complications discussed.  He desired repair and chose open left inguinal hernia  The risk of hernia repair include bleeding,  Infection,   Recurrence of the hernia,  Mesh use, chronic pain,  Organ injury,  Bowel injury,  Bladder injury,   nerve injury with numbness around the incision,  Death,  and worsening of preexisting  medical problems.  The alternatives to surgery have been discussed as well..  Long term expectations of both operative and non operative treatments have been discussed.   The patient agrees to proceed.  Pre-operative Diagnosis: left reducible inguinal hernia  Post-operative Diagnosis: same  Surgeon: Turner Daniels MD  Assistants: None  Anesthesia: General endotracheal anesthesia, Local anesthesia 0.25.% bupivacaine, with epinephrine and tap block  ASA Class: 2  Procedure Details  The patient was seen again in the Holding Room. The risks, benefits, complications, treatment options, and expected outcomes were discussed with the patient. The possibilities of reaction to medication, pulmonary aspiration, perforation of viscus, bleeding, recurrent infection, the need for additional procedures, and development of a complication requiring transfusion or further operation were discussed with the patient and/or family. There was concurrence with the proposed plan, and informed consent was obtained. The site of surgery was properly noted/marked. The patient was taken to the Operating Room, identified as Patrick Rivas, and the procedure verified as hernia repair. A Time Out was held and the above  information confirmed.  Patient underwent preoperative tap block  The patient was placed in the supine position and underwent induction of anesthesia, the lower abdomen and groin was prepped and draped in the standard fashion, and 0.25% Marcaine with epinephrine was used to anesthetize the skin over the mid-portion of the inguinal canal. A transverse incision was made. Dissection was carried through the soft tissue to expose the inguinal canal and inguinal ligament along its lower edge. The external oblique fascia was split along the course of its fibers, exposing the inguinal canal. The cord and nerve were looped using a Penrose drain and reflected out of the field.  Large indirect sac was dissected away from the cord structures preserving the cord structures and the ilioinguinal nerve.  The defect was exposed and a piece of prolene hernia system ultrapro mesh was and placed into the defect.  The inner leaflet was dispersed into the preperitoneal space and the onlay was placed onto the floor of the inguinal canal.  Interupted 2-0 novafil suture was then used  to repair the defect, with the suture being sewn from the pubic tubercle inferiorly and superiorly along the canal to a level just beyond the internal ring. The mesh was split to allow passage of the cord and nerve into the canal without entrapment.  The tip of my fifth digit could fit alongside the cord.  The contents were then returned to canal and the external oblique fashion was then closed in a continuous fashion using 3-0 Vicryl suture taking care not to cause entrapment. Scarpa's layer closed with 3 0 vicryl and 4 0 monocryl used to close the skin.  Dermabond used for dressing.  Instrument, sponge, and needle counts were  correct prior to closure and at the conclusion of the case.  Findings: Hernia as above  Estimated Blood Loss: Minimal         Drains: None         Total IV Fluids: Per record         Specimens: None                Complications: None; patient tolerated the procedure well.         Disposition: PACU - hemodynamically stable.         Condition: stable

## 2019-01-30 NOTE — Anesthesia Procedure Notes (Signed)
Anesthesia Regional Block: TAP block   Pre-Anesthetic Checklist: ,, timeout performed, Correct Patient, Correct Site, Correct Laterality, Correct Procedure, Correct Position, site marked, Risks and benefits discussed,  Surgical consent,  Pre-op evaluation,  At surgeon's request and post-op pain management  Laterality: Left  Prep: chloraprep       Needles:  Injection technique: Single-shot  Needle Type: Echogenic Stimulator Needle     Needle Length: 10cm  Needle Gauge: 21     Additional Needles:   Procedures:,,,, ultrasound used (permanent image in chart),,,,  Narrative:  Start time: 01/30/2019 8:00 AM End time: 01/30/2019 8:10 AM Injection made incrementally with aspirations every 5 mL.  Performed by: Personally  Anesthesiologist: Murvin Natal, MD  Additional Notes: Functioning IV was confirmed and monitors were applied.  A timeout was performed. Sterile prep, hand hygiene and sterile gloves were used. A 158mm 21ga Pajunk echogenic stimulator needle was used. Negative aspiration and negative test dose prior to incremental administration of local anesthetic. The patient tolerated the procedure well.  Ultrasound guidance: relevent anatomy identified, needle position confirmed, local anesthetic spread visualized around nerve(s), vascular puncture avoided.  Image printed for medical record.

## 2019-01-30 NOTE — Progress Notes (Signed)
Assisted Dr. Roanna Banning with left, ultrasound guided, transabdominal plane block. Side rails up, monitors on throughout procedure. See vital signs in flow sheet. Tolerated Procedure well.

## 2019-01-30 NOTE — Anesthesia Procedure Notes (Signed)
Procedure Name: Intubation Date/Time: 01/30/2019 8:48 AM Performed by: Marrianne Mood, CRNA Pre-anesthesia Checklist: Patient identified, Emergency Drugs available, Suction available, Patient being monitored and Timeout performed Patient Re-evaluated:Patient Re-evaluated prior to induction Oxygen Delivery Method: Circle system utilized Preoxygenation: Pre-oxygenation with 100% oxygen Induction Type: IV induction Ventilation: Mask ventilation without difficulty Laryngoscope Size: Miller and 3 Grade View: Grade II Tube type: Oral Tube size: 8.0 mm Number of attempts: 1 Airway Equipment and Method: Stylet and Oral airway Placement Confirmation: ETT inserted through vocal cords under direct vision,  positive ETCO2 and breath sounds checked- equal and bilateral Secured at: 22 cm Tube secured with: Tape Dental Injury: Teeth and Oropharynx as per pre-operative assessment

## 2019-01-31 ENCOUNTER — Encounter (HOSPITAL_BASED_OUTPATIENT_CLINIC_OR_DEPARTMENT_OTHER): Payer: Self-pay | Admitting: Surgery

## 2019-04-12 ENCOUNTER — Telehealth: Payer: Self-pay

## 2019-09-11 NOTE — Telephone Encounter (Signed)
Close encounter 

## 2019-11-30 DEATH — deceased
